# Patient Record
Sex: Female | Born: 1970 | Race: White | Hispanic: No | Marital: Married | Smoking: Current every day smoker
Health system: Southern US, Community
[De-identification: ages and names within clinical notes are randomized; demographics above are authoritative.]

## PROBLEM LIST (undated history)

## (undated) DIAGNOSIS — F419 Anxiety disorder, unspecified: Secondary | ICD-10-CM

## (undated) DIAGNOSIS — F329 Major depressive disorder, single episode, unspecified: Secondary | ICD-10-CM

## (undated) DIAGNOSIS — N871 Moderate cervical dysplasia: Secondary | ICD-10-CM

## (undated) DIAGNOSIS — IMO0002 Reserved for concepts with insufficient information to code with codable children: Secondary | ICD-10-CM

## (undated) DIAGNOSIS — G479 Sleep disorder, unspecified: Secondary | ICD-10-CM

## (undated) DIAGNOSIS — R87629 Unspecified abnormal cytological findings in specimens from vagina: Secondary | ICD-10-CM

## (undated) DIAGNOSIS — E559 Vitamin D deficiency, unspecified: Secondary | ICD-10-CM

## (undated) DIAGNOSIS — F32A Depression, unspecified: Secondary | ICD-10-CM

## (undated) DIAGNOSIS — Z87898 Personal history of other specified conditions: Secondary | ICD-10-CM

## (undated) DIAGNOSIS — K2960 Other gastritis without bleeding: Secondary | ICD-10-CM

## (undated) DIAGNOSIS — K219 Gastro-esophageal reflux disease without esophagitis: Secondary | ICD-10-CM

## (undated) DIAGNOSIS — Z87442 Personal history of urinary calculi: Secondary | ICD-10-CM

## (undated) HISTORY — DX: Depression, unspecified: F32.A

## (undated) HISTORY — DX: Personal history of other specified conditions: Z87.898

## (undated) HISTORY — DX: Anxiety disorder, unspecified: F41.9

## (undated) HISTORY — DX: Reserved for concepts with insufficient information to code with codable children: IMO0002

## (undated) HISTORY — DX: Vitamin D deficiency, unspecified: E55.9

## (undated) HISTORY — PX: BREAST EXCISIONAL BIOPSY: SUR124

## (undated) HISTORY — DX: Sleep disorder, unspecified: G47.9

## (undated) HISTORY — DX: Unspecified abnormal cytological findings in specimens from vagina: R87.629

## (undated) HISTORY — DX: Moderate cervical dysplasia: N87.1

## (undated) HISTORY — DX: Major depressive disorder, single episode, unspecified: F32.9

## (undated) HISTORY — DX: Other gastritis without bleeding: K29.60

## (undated) HISTORY — PX: COLPOSCOPY W/ BIOPSY / CURETTAGE: SUR283

---

## 2006-04-17 ENCOUNTER — Emergency Department: Payer: Self-pay | Admitting: Emergency Medicine

## 2008-10-26 ENCOUNTER — Emergency Department: Payer: Self-pay

## 2008-10-28 ENCOUNTER — Ambulatory Visit: Payer: Self-pay | Admitting: Physician Assistant

## 2008-12-05 HISTORY — PX: FOOT SURGERY: SHX648

## 2010-05-19 ENCOUNTER — Observation Stay: Payer: Self-pay | Admitting: Specialist

## 2012-09-28 ENCOUNTER — Ambulatory Visit: Payer: Self-pay | Admitting: Family Medicine

## 2013-09-19 ENCOUNTER — Emergency Department: Payer: Self-pay | Admitting: Emergency Medicine

## 2013-09-19 LAB — COMPREHENSIVE METABOLIC PANEL
Alkaline Phosphatase: 71 U/L (ref 50–136)
BUN: 13 mg/dL (ref 7–18)
Bilirubin,Total: 0.6 mg/dL (ref 0.2–1.0)
Calcium, Total: 9 mg/dL (ref 8.5–10.1)
Glucose: 116 mg/dL — ABNORMAL HIGH (ref 65–99)
Osmolality: 277 (ref 275–301)
SGOT(AST): 26 U/L (ref 15–37)
SGPT (ALT): 21 U/L (ref 12–78)
Sodium: 138 mmol/L (ref 136–145)

## 2013-09-19 LAB — URINALYSIS, COMPLETE
Bilirubin,UR: NEGATIVE
Blood: NEGATIVE
Ketone: NEGATIVE
Leukocyte Esterase: NEGATIVE
Nitrite: NEGATIVE
Ph: 6 (ref 4.5–8.0)
Protein: NEGATIVE
RBC,UR: 2 /HPF (ref 0–5)
Squamous Epithelial: 1
WBC UR: 1 /HPF (ref 0–5)

## 2013-09-19 LAB — CBC
HCT: 37.6 % (ref 35.0–47.0)
HGB: 13.4 g/dL (ref 12.0–16.0)
MCH: 31.4 pg (ref 26.0–34.0)
MCV: 88 fL (ref 80–100)
Platelet: 201 10*3/uL (ref 150–440)
RBC: 4.26 10*6/uL (ref 3.80–5.20)

## 2013-09-19 LAB — WET PREP, GENITAL

## 2014-11-16 LAB — LIPID PANEL
CHOLESTEROL: 184 mg/dL (ref 0–200)
HDL: 80 mg/dL — AB (ref 35–70)
LDL Cholesterol: 87 mg/dL
TRIGLYCERIDES: 86 mg/dL (ref 40–160)

## 2014-11-16 LAB — TSH: TSH: 1.62 u[IU]/mL (ref <=5.90)

## 2014-11-16 LAB — BASIC METABOLIC PANEL: Glucose: 83 mg/dL

## 2014-11-16 LAB — HM PAP SMEAR

## 2015-01-22 DIAGNOSIS — D069 Carcinoma in situ of cervix, unspecified: Secondary | ICD-10-CM | POA: Insufficient documentation

## 2015-02-03 HISTORY — PX: LEEP: SHX91

## 2015-02-04 ENCOUNTER — Ambulatory Visit: Payer: Self-pay | Admitting: Obstetrics and Gynecology

## 2015-02-09 ENCOUNTER — Ambulatory Visit: Payer: Self-pay | Admitting: Obstetrics and Gynecology

## 2015-02-18 DIAGNOSIS — Z9889 Other specified postprocedural states: Secondary | ICD-10-CM | POA: Insufficient documentation

## 2015-03-10 ENCOUNTER — Ambulatory Visit
Admit: 2015-03-10 | Disposition: A | Discharge status: Home / Self Care | Payer: Self-pay | Attending: Obstetrics and Gynecology | Admitting: Obstetrics and Gynecology

## 2015-03-30 LAB — SURGICAL PATHOLOGY

## 2015-04-05 NOTE — Op Note (Signed)
PATIENT NAME:  Jasmine Hodges, Jasmine Hodges MR#:  [id number] DATE OF BIRTH:  1971/11/27  DATE OF PROCEDURE:  02/09/2015  PREOPERATIVE DIAGNOSIS: Cervical intraepithelial neoplasia, 2-3, with positive endocervical curettage, and Mirena intrauterine device  in place.   POSTOPERATIVE DIAGNOSIS: Cervical intraepithelial neoplasia, 2-3, with positive endocervical curettage, and Mirena intrauterine device  in place.   OPERATION: Loop electrosurgical excision procedure with endocervical curettings.   SURGEON:  Yasmin Bronaugh S. Marcelline Mates, MD  ANESTHESIA: General.   ESTIMATED BLOOD LOSS: 25 mL.  OPERATIVE FLUIDS: 900 mL.   URINE OUTPUT: 50 mL.   COMPLICATIONS: None.   FINDINGS: Mirena IUD strings were visible from the cervical os. There was no decreased Lugol uptake during staining.    SPECIMEN:  ECC, ectocervix and endocervix.   CONDITION: Stable.   INDICATIONS FOR PROCEDURE: The patient is a 44 year old multiparous female with a history of abnormal Pap smear and colposcopy demonstrated CIN-2, with ECC demonstrating CIN-3. The patient has no significant past medical history or surgical history.  She was advised to undergo a LEEP procedure with ECC and all risks and benefits were discussed with the patient including potential risk of loss of Mirena IUD or shortening of Mirena IUD string,  cervical incompetence or cervical stenosis. The patient accepted all risks.    PROCEDURE: The patient was taken to the operating room where she was placed under general anesthesia without difficulty. She was then prepped and draped in a normal sterile fashion and placed in the dorsal lithotomy position. Next, a Graves speculum was placed into the vagina. Lugol solution was painted along the entire cervix and vaginal wall. The areas of non- uptake were noted to be around the entire squamocolumnar junction. The large loop electrode was then used to remove the anterior or top portion of the cervix and then a separate posterior specimen was  excised and sent to pathology. The top portion of the cervix was tagged at 12:00 using a 0 non-coated Vicryl.  The posterior specimen was tagged at 6:00 and was tagged with a coated 0 Vicryl. Next, a smaller loop electrode was used to obtain a circumferential excision of the endocervix. An endocervical curettage was performed and the specimen was placed on Telfa pad and sent to pathology.   At the end of the procedure, the IUD strings were noted to have been shortened; however, were still visible in the endocervical canal. The rollerball was then used to achieve hemostasis. An astringent solution was then applied to the cervix for added hemostasis. The speculum was removed from the patient's vagina. The patient tolerated the procedure well. She was then taken to the recovery room in stable condition.   ____________________________ Chesley Noon Marcelline Mates, MD asc:LT D: 02/09/2015 16:30:35 ET T: 02/09/2015 19:31:13 ET JOB#: [id number]  cc: Chesley Noon. Marcelline Mates, MD, <Dictator> Augusto Gamble MD ELECTRONICALLY SIGNED 03/16/2015 11:03

## 2015-08-24 ENCOUNTER — Encounter: Payer: Self-pay | Admitting: *Deleted

## 2015-08-28 ENCOUNTER — Ambulatory Visit: Payer: Self-pay | Admitting: Obstetrics and Gynecology

## 2015-09-17 ENCOUNTER — Other Ambulatory Visit: Payer: Self-pay | Admitting: Obstetrics and Gynecology

## 2015-09-17 ENCOUNTER — Ambulatory Visit (INDEPENDENT_AMBULATORY_CARE_PROVIDER_SITE_OTHER): Payer: BC Managed Care – PPO | Admitting: Obstetrics and Gynecology

## 2015-09-17 ENCOUNTER — Encounter: Payer: Self-pay | Admitting: Obstetrics and Gynecology

## 2015-09-17 VITALS — BP 109/66 | HR 73 | Ht 66.0 in | Wt 145.8 lb

## 2015-09-17 DIAGNOSIS — R87613 High grade squamous intraepithelial lesion on cytologic smear of cervix (HGSIL): Secondary | ICD-10-CM

## 2015-09-17 NOTE — Progress Notes (Signed)
Subjective:     Patient ID: Jasmine Hodges, female   DOB: September 04, 1971, 44 y.o.   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    Assessment:     H/o HGSIL, S/P LEEP     Plan:     Pap obtained, will proceed with next pap at [place] in 6 months  [place], [place]

## 2015-09-22 LAB — CYTOLOGY - PAP

## 2015-09-29 ENCOUNTER — Other Ambulatory Visit: Payer: Self-pay | Admitting: Obstetrics and Gynecology

## 2015-09-29 DIAGNOSIS — IMO0002 Reserved for concepts with insufficient information to code with codable children: Secondary | ICD-10-CM

## 2015-09-30 ENCOUNTER — Telehealth: Payer: Self-pay | Admitting: Obstetrics and Gynecology

## 2015-09-30 NOTE — Telephone Encounter (Signed)
SHE CALLED FOR RESULTS OD PAP SMEAR.

## 2015-10-01 NOTE — Telephone Encounter (Signed)
Please see lab note and give her call back for me

## 2015-10-02 ENCOUNTER — Telehealth: Payer: Self-pay | Admitting: *Deleted

## 2015-10-02 NOTE — Telephone Encounter (Signed)
Notified pt of results, she will do colpo with Dr.Cherry or MNB whichever has the 1st available

## 2015-10-02 NOTE — Telephone Encounter (Signed)
Pt was notified about her results, appt made for colpo

## 2015-10-02 NOTE — Telephone Encounter (Signed)
-----   Message from Jasmine Hodges, [place] sent at 10/01/2015  4:29 PM EDT ----- Can you let her know pap results went back up to LGSIL- will need to repeat the colpo- I talked to [facility] and she is fine with doing it or I can do it- Which ever Candies desires, and will follow-up accordingly. Needs to schedule.

## 2015-10-21 ENCOUNTER — Ambulatory Visit (INDEPENDENT_AMBULATORY_CARE_PROVIDER_SITE_OTHER): Payer: BC Managed Care – PPO | Admitting: Obstetrics and Gynecology

## 2015-10-21 ENCOUNTER — Encounter: Payer: Self-pay | Admitting: Obstetrics and Gynecology

## 2015-10-21 VITALS — BP 101/65 | HR 61 | Ht 66.0 in | Wt 146.3 lb

## 2015-10-21 DIAGNOSIS — Z9889 Other specified postprocedural states: Secondary | ICD-10-CM

## 2015-10-21 DIAGNOSIS — R87612 Low grade squamous intraepithelial lesion on cytologic smear of cervix (LGSIL): Secondary | ICD-10-CM

## 2015-10-21 DIAGNOSIS — N871 Moderate cervical dysplasia: Secondary | ICD-10-CM

## 2015-10-23 LAB — PATHOLOGY

## 2015-10-23 NOTE — Progress Notes (Signed)
GYNECOLOGY CLINIC COLPOSCOPY PROCEDURE NOTE  44 y.o. [id number] here for colposcopy for low-grade squamous intraepithelial neoplasia (LGSIL - encompassing HPV,mild dysplasia,CIN I) pap smear on 09/2015. Patient has a history of CIN II, is s/p LEEP in 02/2015.  Patient given verbal informed consent.  Placed in lithotomy position. Cervix viewed with speculum and colposcope after application of acetic acid.   Colposcopy adequate? Yes  acetowhite lesion(s) noted at 5 o'clock and mosaicism noted at 3 and 9 o'clock; corresponding biopsies obtained.  ECC specimen obtained. All specimens were labelled and sent to pathology.  Patient was given post procedure instructions.  Will follow up pathology and manage accordingly.  Routine preventative health maintenance measures emphasized.    Rubie Maid, MD [facility]

## 2015-10-27 ENCOUNTER — Encounter: Payer: Self-pay | Admitting: Obstetrics and Gynecology

## 2015-11-03 ENCOUNTER — Ambulatory Visit: Payer: BC Managed Care – PPO | Admitting: Obstetrics and Gynecology

## 2015-11-11 ENCOUNTER — Encounter: Payer: Self-pay | Admitting: Obstetrics and Gynecology

## 2016-02-16 ENCOUNTER — Other Ambulatory Visit: Payer: Self-pay | Admitting: Obstetrics and Gynecology

## 2016-03-17 ENCOUNTER — Encounter: Payer: BC Managed Care – PPO | Admitting: Obstetrics and Gynecology

## 2016-03-21 ENCOUNTER — Other Ambulatory Visit: Payer: Self-pay | Admitting: Nurse Practitioner

## 2016-03-21 ENCOUNTER — Ambulatory Visit
Admission: RE | Admit: 2016-03-21 | Discharge: 2016-03-21 | Disposition: A | Discharge status: Home / Self Care | Payer: BC Managed Care – PPO | Source: Ambulatory Visit | Attending: Nurse Practitioner | Admitting: Nurse Practitioner

## 2016-03-21 DIAGNOSIS — N200 Calculus of kidney: Secondary | ICD-10-CM

## 2016-03-23 ENCOUNTER — Encounter: Payer: Self-pay | Admitting: Urology

## 2016-03-23 ENCOUNTER — Ambulatory Visit (INDEPENDENT_AMBULATORY_CARE_PROVIDER_SITE_OTHER): Payer: BC Managed Care – PPO | Admitting: Urology

## 2016-03-23 VITALS — BP 93/56 | HR 56 | Ht 66.0 in | Wt 147.9 lb

## 2016-03-23 DIAGNOSIS — R109 Unspecified abdominal pain: Secondary | ICD-10-CM

## 2016-03-23 DIAGNOSIS — R310 Gross hematuria: Secondary | ICD-10-CM

## 2016-03-23 DIAGNOSIS — N200 Calculus of kidney: Secondary | ICD-10-CM

## 2016-03-23 LAB — URINALYSIS, COMPLETE
Bilirubin, UA: NEGATIVE
GLUCOSE, UA: NEGATIVE
KETONES UA: NEGATIVE
LEUKOCYTES UA: NEGATIVE
Nitrite, UA: NEGATIVE
Protein, UA: NEGATIVE
RBC, UA: NEGATIVE
Urobilinogen, Ur: 0.2 mg/dL (ref 0.2–1.0)
pH, UA: 7 (ref 5.0–7.5)

## 2016-03-23 LAB — MICROSCOPIC EXAMINATION
Bacteria, UA: NONE SEEN
RBC, UA: NONE SEEN /hpf (ref 0–?)

## 2016-03-23 MED ORDER — TAMSULOSIN HCL 0.4 MG PO CAPS: 0.4000 mg | ORAL_CAPSULE | Freq: Every day | ORAL | Status: DC

## 2016-03-23 MED ORDER — OXYCODONE-ACETAMINOPHEN 10-325 MG PO TABS: 1.0000 | ORAL_TABLET | ORAL | Status: DC | PRN

## 2016-03-23 NOTE — Progress Notes (Signed)
03/23/2016 12:06 PM   Jasmine Hodges [birth year] [id number]  Referring provider: [name], NP [place], [place] [address]  Chief Complaint  Patient presents with  . Nephrolithiasis    referred by [facility]    HPI: Patient is a 45 year old Caucasian female referred to [place] by her primary care physician, [name], NP, were a possible left ureteral stone.  Patient states about 1 week ago she had the sudden onset of left-sided back pain with gross hematuria. The pain was intense and then eased off. It then returned to the left groin area and now it feels like a bad toothache.  She has urinary frequency associated with this pain.  She denied fever and chills, but she admitted to nausea and vomiting.  She had a renal ultrasound completed on 03/21/2016 which noted suggestions of a non obstructing stone in the distal left ureter but could not be definitive.  She believes she may have had a stone approximately 14 years ago during a pregnancy, but she never passed a fragment and she states it was not demonstrated on imaging.  She is a former smoker.  UA today was unremarkable.  PMH: Past Medical History  Diagnosis Date  . Vitamin D deficiency   . Anxiety   . Depression   . Sleep disturbance   . Reflux gastritis   . Vaginal Pap smear, abnormal   . Kidney stone     Surgical History: Past Surgical History  Procedure Laterality Date  . Leep  02/2015  . Foot surgery Left 2010    Home Medications:    Medication List       This list is accurate as of: 03/23/16 12:06 PM.  Always use your most recent med list.               ALPRAZolam 0.5 MG tablet  Commonly known as:  XANAX  Take 0.5 mg by mouth at bedtime as needed for anxiety.     ciprofloxacin 500 MG tablet  Commonly known as:  CIPRO  Reported on 03/23/2016     HYDROcodone-acetaminophen 5-325 MG tablet  Commonly known as:  NORCO/VICODIN     Melatonin 3 MG Tabs  Take by mouth.     oxyCODONE-acetaminophen 10-325 MG tablet  Commonly known as:  PERCOCET  Take 1 tablet by mouth every 4 (four) hours as needed for pain.     tamsulosin 0.4 MG Caps capsule  Commonly known as:  FLOMAX  Take 1 capsule (0.4 mg total) by mouth daily. Reported on 03/23/2016        Allergies: No Known Allergies  Family History: Family History  Problem Relation Age of Onset  . Lung cancer Mother   . Hypertension Father   . Kidney disease Neg Hx   . Bladder Cancer Neg Hx   . Kidney Stones Father     Social History:  reports that she has quit smoking. She has never used smokeless tobacco. She reports that she drinks alcohol. She reports that she does not use illicit drugs.  ROS: UROLOGY Frequent Urination?: Yes Hard to postpone urination?: No Burning/pain with urination?: No Get up at night to urinate?: No Leakage of urine?: No Urine stream starts and stops?: No Trouble starting stream?: No Do you have to strain to urinate?: No Blood in urine?: No Urinary tract infection?: No Sexually transmitted disease?: No Injury to kidneys or bladder?: No Painful intercourse?: No Weak stream?: No Currently pregnant?: No Vaginal bleeding?: No Last menstrual  period?: 0414/2017  Gastrointestinal Nausea?: No Vomiting?: No Indigestion/heartburn?: No Diarrhea?: No Constipation?: No  Constitutional Fever: No Night sweats?: No Weight loss?: No Fatigue?: No  Skin Skin rash/lesions?: No Itching?: No  Eyes Blurred vision?: No Double vision?: No  Ears/Nose/Throat Sore throat?: No Sinus problems?: No  Hematologic/Lymphatic Swollen glands?: No Easy bruising?: No  Cardiovascular Leg swelling?: No Chest pain?: No  Respiratory Cough?: No Shortness of breath?: No  Endocrine Excessive thirst?: No  Musculoskeletal Back pain?: No Joint pain?: No  Neurological Headaches?: No Dizziness?: No  Psychologic Depression?: No Anxiety?: No  Physical Exam: BP 93/56 mmHg   Pulse 56  Ht 5\' 6"  (1.676 m)  Wt 147 lb 14.4 oz (67.087 kg)  BMI 23.88 kg/m2  LMP 03/18/2016  Constitutional: Well nourished. Alert and oriented, No acute distress. HEENT: [place] AT, moist mucus membranes. Trachea midline, no masses. Cardiovascular: No clubbing, cyanosis, or edema. Respiratory: Normal respiratory effort, no increased work of breathing. GI: Abdomen is soft, non tender, non distended, no abdominal masses. Liver and spleen not palpable.  No hernias appreciated.  Stool sample for occult testing is not indicated.   GU: No CVA tenderness.  No bladder fullness or masses.   Skin: No rashes, bruises or suspicious lesions. Lymph: No cervical or inguinal adenopathy. Neurologic: Grossly intact, no focal deficits, moving all 4 extremities. Psychiatric: Normal mood and affect.  Laboratory Data: Lab Results  Component Value Date   WBC 8.2 09/19/2013   HGB 13.4 09/19/2013   HCT 37.6 09/19/2013   MCV 88 09/19/2013   PLT 201 09/19/2013    Lab Results  Component Value Date   CREATININE 0.85 09/19/2013     Lab Results  Component Value Date   TSH 1.62 11/16/2014       Component Value Date/Time   CHOL 184 11/16/2014   HDL 80* 11/16/2014   LDLCALC 87 11/16/2014    Lab Results  Component Value Date   AST 26 09/19/2013   Lab Results  Component Value Date   ALT 21 09/19/2013   Urinalysis Results for orders placed or performed in visit on 03/23/16  Microscopic Examination  Result Value Ref Range   WBC, UA 0-5 0 -  5 /hpf   RBC, UA None seen 0 -  2 /hpf   Epithelial Cells (non renal) 0-10 0 - 10 /hpf   Bacteria, UA None seen None seen/Few  Urinalysis, Complete  Result Value Ref Range   Specific Gravity, UA <1.005 (L) 1.005 - 1.030   pH, UA 7.0 5.0 - 7.5   Color, UA Yellow Yellow   Appearance Ur Clear Clear   Leukocytes, UA Negative Negative   Protein, UA Negative Negative/Trace   Glucose, UA Negative Negative   Ketones, UA Negative Negative   RBC, UA Negative  Negative   Bilirubin, UA Negative Negative   Urobilinogen, Ur 0.2 0.2 - 1.0 mg/dL   Nitrite, UA Negative Negative   Microscopic Examination See below:    Pertinent Imaging: CLINICAL DATA: Left flank pain.  EXAM: RENAL / URINARY TRACT ULTRASOUND COMPLETE  COMPARISON: None  FINDINGS: Right Kidney:  Length: 10.3 cm. Slight fullness of the right renal pelvis without a visible stone. Echogenicity within normal limits. No mass or hydronephrosis visualized.  Left Kidney:  Length: 10.0 cm. Bilateral ureteral jets identified.  Bladder:  Appears normal for degree of bladder distention. On image 35 of 36 there is suggestion of a 5 mm stone in the distal left ureter but this is not definitive and  the ureter certainly patent.  IMPRESSION: Suggestion of a nonobstructing stone in the distal left ureter but this is not definitive.   Electronically Signed  By: Lorriane Shire M.D.  On: 03/21/2016 16:26   Assessment & Plan:    1. Kidney stones:   Patient had findings suggestive of a kidney stone on renal ultrasound, but it is not definitive.  We'll obtain a CT urogram at this time.  I explained to the patient that a CT urogram was needed at this time so that we could clearly identify a stone if it is present.  It will also help [place] decide what definitive treatment would give her the best success of ridding her of the stone if one is present.  I also explained because of the blood in the urine undergoing a hematuria workup at this time is prudent especially with her history of smoking.  2. Gross hematuria:   Explained to patient the causes of blood in the urine are as follows: stones, UTI's, damage to the urinary tract and/or cancer.   It is explained to the patient that they will be scheduled for a CT Urogram with contrast material and that in rare instances, an allergic reaction can be serious and even life threatening with the injection of contrast material.   The patient  denies any allergies to contrast, iodine and/or seafood and is not taking metformin.  - Urinalysis, Complete - CULTURE, URINE COMPREHENSIVE  3. Left flank pain:  Patient was given Percocet 10/325  #10.  A refill is given for her tamsulosin.  She does have a strainer and will continue to strain her urine.  Contact us if she passes a stone.  If she should experience fevers/chills, intractable vomiting or pain, she should contact the office immediately or seek treatment in the emergency room.  CT urogram is pending at this time.   Return for I will call the patient with results.  These notes generated with voice recognition software. I apologize for typographical errors.  Zara Council, [facility] [address], [place],  [address] [phone]

## 2016-03-24 ENCOUNTER — Encounter: Payer: BC Managed Care – PPO | Admitting: Obstetrics and Gynecology

## 2016-03-24 LAB — BUN+CREAT
BUN/Creatinine Ratio: 19 (ref 9–23)
BUN: 16 mg/dL (ref 6–24)
Creatinine, Ser: 0.84 mg/dL (ref 0.57–1.00)
GFR calc non Af Amer: 85 mL/min/{1.73_m2} (ref >59)
GFR, EST AFRICAN AMERICAN: 98 mL/min/{1.73_m2} (ref >59)

## 2016-03-24 LAB — HCG, SERUM, QUALITATIVE: hCG,Beta Subunit,Qual,Serum: NEGATIVE m[IU]/mL (ref <6)

## 2016-03-25 ENCOUNTER — Ambulatory Visit
Admission: RE | Admit: 2016-03-25 | Discharge: 2016-03-25 | Disposition: A | Discharge status: Home / Self Care | Payer: BC Managed Care – PPO | Source: Ambulatory Visit | Attending: Urology | Admitting: Urology

## 2016-03-25 DIAGNOSIS — N201 Calculus of ureter: Secondary | ICD-10-CM | POA: Diagnosis not present

## 2016-03-25 DIAGNOSIS — K769 Liver disease, unspecified: Secondary | ICD-10-CM | POA: Insufficient documentation

## 2016-03-25 DIAGNOSIS — Z975 Presence of (intrauterine) contraceptive device: Secondary | ICD-10-CM | POA: Insufficient documentation

## 2016-03-25 DIAGNOSIS — R310 Gross hematuria: Secondary | ICD-10-CM | POA: Diagnosis not present

## 2016-03-25 MED ORDER — IOPAMIDOL (ISOVUE-300) INJECTION 61%
150.0000 mL | Freq: Once | INTRAVENOUS | Status: AC | PRN
  Administered 2016-03-25: 150 mL via INTRAVENOUS

## 2016-03-27 LAB — CULTURE, URINE COMPREHENSIVE

## 2016-03-28 ENCOUNTER — Telehealth: Payer: Self-pay | Admitting: Radiology

## 2016-03-28 ENCOUNTER — Other Ambulatory Visit: Payer: Self-pay | Admitting: Urology

## 2016-03-28 DIAGNOSIS — R109 Unspecified abdominal pain: Secondary | ICD-10-CM

## 2016-03-28 MED ORDER — OXYCODONE-ACETAMINOPHEN 10-325 MG PO TABS: 1.0000 | ORAL_TABLET | ORAL | Status: DC | PRN

## 2016-03-28 NOTE — Telephone Encounter (Signed)
Pt's husband, Shon Baton, called stating pt needs a refill of percocet. States pt's pain is 8/10 prior to taking medication and decreases to 3-4/10 after taking it. He asks if this is normal & can she wait until surgery that is scheduled for 04/11/16? Please advise.

## 2016-03-28 NOTE — Telephone Encounter (Signed)
Refill for Percocet is given.  Prescription is printed. It will need to be signed.  Her pain is normal for a ureteral stone.  As long as she can control the pain with analgesics, she is not experiencing intractable vomiting, she is not having fevers or chills, it is reasonable to wait until May 8 for the procedure.  The mean passage rate for stones 6-8 mm in size is 22 days.  If she can no longer control the pain with analgesics, experiences intractable vomiting or develops fevers or chills, she will need to seek treatment in the emergency room.  At that time, a ureteral stent will be placed to relieve the obstruction and the stone would be addressed at a different encounter.  I suggest she continue her Flomax, take the Percocet every 4 hours if needed for pain and push fluids in an effort to try to pass the stone.

## 2016-03-28 NOTE — Telephone Encounter (Signed)
Notified pt's husband, Shon Baton, of surgery scheduled 04/11/16, pre-admit testing appt on 4/26 @ 10:15 and to call Friday prior to surgery for arrival time to SDS. [place] voices understanding.

## 2016-03-29 NOTE — Telephone Encounter (Signed)
Signed order was given to pt's husband. Printed and read below message from Sun to him. Questions answered. He voices understanding.

## 2016-03-30 ENCOUNTER — Encounter
Admission: RE | Admit: 2016-03-30 | Discharge: 2016-03-30 | Disposition: A | Discharge status: Home / Self Care | Payer: BC Managed Care – PPO | Source: Ambulatory Visit | Attending: Urology | Admitting: Urology

## 2016-03-30 DIAGNOSIS — Z01812 Encounter for preprocedural laboratory examination: Secondary | ICD-10-CM | POA: Insufficient documentation

## 2016-03-30 HISTORY — DX: Gastro-esophageal reflux disease without esophagitis: K21.9

## 2016-03-30 LAB — CBC
HEMATOCRIT: 38.6 % (ref 35.0–47.0)
Hemoglobin: 13.1 g/dL (ref 12.0–16.0)
MCH: 30 pg (ref 26.0–34.0)
MCHC: 34 g/dL (ref 32.0–36.0)
MCV: 88.1 fL (ref 80.0–100.0)
Platelets: 209 10*3/uL (ref 150–440)
RBC: 4.38 MIL/uL (ref 3.80–5.20)
RDW: 12.5 % (ref 11.5–14.5)
WBC: 8.3 10*3/uL (ref 3.6–11.0)

## 2016-03-30 LAB — BASIC METABOLIC PANEL
Anion gap: 5 (ref 5–15)
BUN: 17 mg/dL (ref 6–20)
CALCIUM: 9.2 mg/dL (ref 8.9–10.3)
CO2: 29 mmol/L (ref 22–32)
CREATININE: 0.82 mg/dL (ref 0.44–1.00)
Chloride: 105 mmol/L (ref 101–111)
GFR calc non Af Amer: >60 mL/min (ref >60)
GLUCOSE: 80 mg/dL (ref 65–99)
Potassium: 4.1 mmol/L (ref 3.5–5.1)
Sodium: 139 mmol/L (ref 135–145)

## 2016-03-30 NOTE — Patient Instructions (Signed)
  Your procedure is scheduled on: 04/11/16 Mon Report to Same Day Surgery 2nd floor medical mall To find out your arrival time please call [phone] between 1PM - 3PM on  04/08/16 Fri  Remember: Instructions that are not followed completely may result in serious medical risk, up to and including death, or upon the discretion of your surgeon and anesthesiologist your surgery may need to be rescheduled.    _x___ 1. Do not eat food or drink liquids after midnight. No gum chewing or hard candies.     __x__ 2. No Alcohol for 24 hours before or after surgery.   ____ 3. Bring all medications with you on the day of surgery if instructed.    __x__ 4. Notify your doctor if there is any change in your medical condition     (cold, fever, infections).     Do not wear jewelry, make-up, hairpins, clips or nail polish.  Do not wear lotions, powders, or perfumes. You may wear deodorant.  Do not shave 48 hours prior to surgery. Men may shave face and neck.  Do not bring valuables to the hospital.    [facility] is not responsible for any belongings or valuables.               Contacts, dentures or bridgework may not be worn into surgery.  Leave your suitcase in the car. After surgery it may be brought to your room.  For patients admitted to the hospital, discharge time is determined by your treatment team.   Patients discharged the day of surgery will not be allowed to drive home.    Please read over the following fact sheets that you were given:   [facility] Preparing for Surgery and or MRSA Information   _x___ Take these medicines the morning of surgery with A SIP OF WATER:    1. oxyCODONE-acetaminophen (PERCOCET) 10-325 MG tablet if needed  2.  3.  4.  5.  6.  ____ Fleet Enema (as directed)   _x___ Use CHG Soap or sage wipes as directed on instruction sheet   ____ Use inhalers on the day of surgery and bring to hospital day of surgery  ____ Stop metformin 2 days prior to  surgery    ____ Take 1/2 of usual insulin dose the night before surgery and none on the morning of           surgery.   ____ Stop aspirin or coumadin, or plavix  _x__ Stop Anti-inflammatories such as Advil, Aleve, Ibuprofen, Motrin, Naproxen,          Naprosyn, Goodies powders or aspirin products. Ok to take Tylenol.   ____ Stop supplements until after surgery.    ____ Bring C-Pap to the hospital.

## 2016-03-31 ENCOUNTER — Telehealth: Payer: Self-pay

## 2016-03-31 ENCOUNTER — Other Ambulatory Visit: Payer: Self-pay | Admitting: Urology

## 2016-03-31 DIAGNOSIS — R109 Unspecified abdominal pain: Secondary | ICD-10-CM

## 2016-03-31 MED ORDER — OXYCODONE-ACETAMINOPHEN 10-325 MG PO TABS: 1.0000 | ORAL_TABLET | ORAL | Status: DC | PRN

## 2016-03-31 NOTE — Telephone Encounter (Signed)
They can pick up another script tomorrow.  Will you ask Amy if we have had any new block time added or cancellations?  If so, we may be able to move her procedure up.

## 2016-03-31 NOTE — Telephone Encounter (Signed)
I have no called the pt yet.

## 2016-03-31 NOTE — Telephone Encounter (Signed)
Pt husband, Shon Baton, called stating pt is still "in a good amount of pain" and wanted to know if she could have more pain medication. Husband states pt takes a pain pill in the morning and one at night before bed and "at this rate she will run out over the weekend and we dont want to go to the ER".  Please advise.

## 2016-04-01 MED ORDER — OXYCODONE-ACETAMINOPHEN 10-325 MG PO TABS: 1.0000 | ORAL_TABLET | ORAL | Status: DC | PRN

## 2016-04-01 NOTE — Telephone Encounter (Signed)
Spoke with pt husband and made aware that a script for pain medication has been put up front. Husband voiced understanding.

## 2016-04-01 NOTE — Telephone Encounter (Signed)
We don't currently have an opening to move the procedure up but if someone cancels we can move it up then.

## 2016-04-05 ENCOUNTER — Other Ambulatory Visit: Payer: Self-pay | Admitting: Urology

## 2016-04-05 ENCOUNTER — Telehealth: Payer: Self-pay | Admitting: Urology

## 2016-04-05 DIAGNOSIS — R109 Unspecified abdominal pain: Secondary | ICD-10-CM

## 2016-04-05 MED ORDER — OXYCODONE-ACETAMINOPHEN 10-325 MG PO TABS: 1.0000 | ORAL_TABLET | ORAL | Status: DC | PRN

## 2016-04-05 NOTE — Telephone Encounter (Signed)
Please advise 

## 2016-04-05 NOTE — Pre-Procedure Instructions (Signed)
EKG no change from Jun 2011

## 2016-04-05 NOTE — Telephone Encounter (Signed)
I have printed a script for the pain medication.  I will have to sign it.

## 2016-04-05 NOTE — Telephone Encounter (Signed)
Spoke with husband and made aware a script is left up front. Husband voiced understanding.

## 2016-04-05 NOTE — Telephone Encounter (Signed)
Patient is scheduled for surgery 04/11/16.  She will run out of pain medication tomorrow morning.  Patient's husband is requesting another prescription for pain medication to get her through until surgery.

## 2016-04-11 ENCOUNTER — Ambulatory Visit: Payer: BC Managed Care – PPO | Admitting: Anesthesiology

## 2016-04-11 ENCOUNTER — Encounter
Admission: RE | Disposition: A | Discharge status: Home / Self Care | Payer: Self-pay | Source: Ambulatory Visit | Attending: Urology

## 2016-04-11 ENCOUNTER — Encounter: Payer: Self-pay | Admitting: *Deleted

## 2016-04-11 ENCOUNTER — Ambulatory Visit
Admission: RE | Admit: 2016-04-11 | Discharge: 2016-04-11 | Disposition: A | Discharge status: Home / Self Care | Payer: BC Managed Care – PPO | Source: Ambulatory Visit | Attending: Urology | Admitting: Urology

## 2016-04-11 DIAGNOSIS — Z9889 Other specified postprocedural states: Secondary | ICD-10-CM | POA: Diagnosis not present

## 2016-04-11 DIAGNOSIS — R109 Unspecified abdominal pain: Secondary | ICD-10-CM

## 2016-04-11 DIAGNOSIS — Z87891 Personal history of nicotine dependence: Secondary | ICD-10-CM | POA: Insufficient documentation

## 2016-04-11 DIAGNOSIS — F329 Major depressive disorder, single episode, unspecified: Secondary | ICD-10-CM | POA: Insufficient documentation

## 2016-04-11 DIAGNOSIS — Z8249 Family history of ischemic heart disease and other diseases of the circulatory system: Secondary | ICD-10-CM | POA: Insufficient documentation

## 2016-04-11 DIAGNOSIS — Z801 Family history of malignant neoplasm of trachea, bronchus and lung: Secondary | ICD-10-CM | POA: Diagnosis not present

## 2016-04-11 DIAGNOSIS — Z79899 Other long term (current) drug therapy: Secondary | ICD-10-CM | POA: Insufficient documentation

## 2016-04-11 DIAGNOSIS — N201 Calculus of ureter: Secondary | ICD-10-CM

## 2016-04-11 DIAGNOSIS — Z87442 Personal history of urinary calculi: Secondary | ICD-10-CM | POA: Insufficient documentation

## 2016-04-11 DIAGNOSIS — R310 Gross hematuria: Secondary | ICD-10-CM | POA: Insufficient documentation

## 2016-04-11 DIAGNOSIS — F419 Anxiety disorder, unspecified: Secondary | ICD-10-CM | POA: Diagnosis not present

## 2016-04-11 HISTORY — PX: STONE EXTRACTION WITH BASKET: SHX5318

## 2016-04-11 HISTORY — PX: CYSTOSCOPY W/ RETROGRADES: SHX1426

## 2016-04-11 HISTORY — PX: CYSTOSCOPY/URETEROSCOPY/HOLMIUM LASER/STENT PLACEMENT: SHX6546

## 2016-04-11 LAB — POCT PREGNANCY, URINE: Preg Test, Ur: NEGATIVE

## 2016-04-11 SURGERY — CYSTOSCOPY, WITH RETROGRADE PYELOGRAM
Anesthesia: General | Site: Ureter | Wound class: Clean Contaminated

## 2016-04-11 MED ORDER — OXYBUTYNIN CHLORIDE 5 MG PO TABS: 5.0000 mg | ORAL_TABLET | Freq: Three times a day (TID) | ORAL | Status: DC | PRN

## 2016-04-11 MED ORDER — HYDROMORPHONE HCL 1 MG/ML IJ SOLN
INTRAMUSCULAR | Status: AC
  Filled 2016-04-11: qty 1

## 2016-04-11 MED ORDER — OXYCODONE HCL 5 MG/5ML PO SOLN: 5.0000 mg | Freq: Once | ORAL | Status: AC | PRN

## 2016-04-11 MED ORDER — ONDANSETRON HCL 4 MG/2ML IJ SOLN
INTRAMUSCULAR | Status: DC | PRN
  Administered 2016-04-11: 4 mg via INTRAVENOUS

## 2016-04-11 MED ORDER — LIDOCAINE HCL (CARDIAC) 20 MG/ML IV SOLN
INTRAVENOUS | Status: DC | PRN
  Administered 2016-04-11: 100 mg via INTRAVENOUS

## 2016-04-11 MED ORDER — FAMOTIDINE 20 MG PO TABS
ORAL_TABLET | ORAL | Status: AC
  Filled 2016-04-11: qty 1

## 2016-04-11 MED ORDER — CEFAZOLIN SODIUM-DEXTROSE 2-4 GM/100ML-% IV SOLN
INTRAVENOUS | Status: AC
  Filled 2016-04-11: qty 100

## 2016-04-11 MED ORDER — PROPOFOL 10 MG/ML IV BOLUS
INTRAVENOUS | Status: DC | PRN
  Administered 2016-04-11: 160 mg via INTRAVENOUS

## 2016-04-11 MED ORDER — OXYCODONE HCL 5 MG PO TABS
5.0000 mg | ORAL_TABLET | Freq: Once | ORAL | Status: AC | PRN
  Administered 2016-04-11: 5 mg via ORAL

## 2016-04-11 MED ORDER — FENTANYL CITRATE (PF) 100 MCG/2ML IJ SOLN
25.0000 ug | INTRAMUSCULAR | Status: DC | PRN
  Administered 2016-04-11 (×3): 50 ug via INTRAVENOUS

## 2016-04-11 MED ORDER — FENTANYL CITRATE (PF) 100 MCG/2ML IJ SOLN
INTRAMUSCULAR | Status: DC | PRN
  Administered 2016-04-11 (×2): 50 ug via INTRAVENOUS

## 2016-04-11 MED ORDER — MIDAZOLAM HCL 2 MG/2ML IJ SOLN
INTRAMUSCULAR | Status: DC | PRN
  Administered 2016-04-11: 2 mg via INTRAVENOUS

## 2016-04-11 MED ORDER — LACTATED RINGERS IV SOLN
INTRAVENOUS | Status: DC
  Administered 2016-04-11 (×3): via INTRAVENOUS

## 2016-04-11 MED ORDER — TAMSULOSIN HCL 0.4 MG PO CAPS: 0.4000 mg | ORAL_CAPSULE | Freq: Every day | ORAL | Status: DC

## 2016-04-11 MED ORDER — FAMOTIDINE 20 MG PO TABS
20.0000 mg | ORAL_TABLET | Freq: Once | ORAL | Status: AC
  Administered 2016-04-11: 20 mg via ORAL

## 2016-04-11 MED ORDER — FENTANYL CITRATE (PF) 100 MCG/2ML IJ SOLN
INTRAMUSCULAR | Status: AC
  Filled 2016-04-11: qty 4

## 2016-04-11 MED ORDER — OXYCODONE-ACETAMINOPHEN 10-325 MG PO TABS: 1.0000 | ORAL_TABLET | ORAL | Status: DC | PRN

## 2016-04-11 MED ORDER — HYDROMORPHONE HCL 1 MG/ML IJ SOLN
0.5000 mg | INTRAMUSCULAR | Status: AC | PRN
  Administered 2016-04-11 (×4): 0.5 mg via INTRAVENOUS

## 2016-04-11 MED ORDER — IOTHALAMATE MEGLUMINE 43 % IV SOLN
INTRAVENOUS | Status: DC | PRN
  Administered 2016-04-11: 20 mL via URETHRAL

## 2016-04-11 MED ORDER — CEFAZOLIN SODIUM-DEXTROSE 2-4 GM/100ML-% IV SOLN
2.0000 g | Freq: Once | INTRAVENOUS | Status: AC
  Administered 2016-04-11: 2 g via INTRAVENOUS

## 2016-04-11 MED ORDER — OXYCODONE HCL 5 MG PO TABS
ORAL_TABLET | ORAL | Status: AC
  Filled 2016-04-11: qty 1

## 2016-04-11 MED ORDER — DEXAMETHASONE SODIUM PHOSPHATE 10 MG/ML IJ SOLN
INTRAMUSCULAR | Status: DC | PRN
  Administered 2016-04-11: 10 mg via INTRAVENOUS

## 2016-04-11 SURGICAL SUPPLY — 35 items
ADAPTER SCOPE UROLOK II (MISCELLANEOUS) IMPLANT
BAG DRAIN CYSTO-URO LG1000N (MISCELLANEOUS) ×4 IMPLANT
BASKET ZERO TIP 1.9FR (BASKET) ×4 IMPLANT
CATH URETL 5X70 OPEN END (CATHETERS) ×4 IMPLANT
CNTNR SPEC 2.5X3XGRAD LEK (MISCELLANEOUS) ×6
CONRAY 43 FOR UROLOGY 50M (MISCELLANEOUS) ×4 IMPLANT
CONT SPEC 4OZ STER OR WHT (MISCELLANEOUS) ×2
CONTAINER SPEC 2.5X3XGRAD LEK (MISCELLANEOUS) ×6 IMPLANT
CORD URO TURP 10FT (MISCELLANEOUS) ×4 IMPLANT
DRAPE UTILITY 15X26 TOWEL STRL (DRAPES) ×4 IMPLANT
ELECT REM PT RETURN 9FT ADLT (ELECTROSURGICAL) ×4
ELECTRODE REM PT RTRN 9FT ADLT (ELECTROSURGICAL) ×3 IMPLANT
FIBER LASER LITHO 273 (Laser) ×4 IMPLANT
GLOVE BIO SURGEON STRL SZ 6.5 (GLOVE) ×4 IMPLANT
GLOVE BIO SURGEON STRL SZ7 (GLOVE) ×8 IMPLANT
GOWN STRL REUS W/ TWL LRG LVL3 (GOWN DISPOSABLE) ×6 IMPLANT
GOWN STRL REUS W/TWL LRG LVL3 (GOWN DISPOSABLE) ×2
INTRODUCER DILATOR DOUBLE (INTRODUCER) IMPLANT
KIT RM TURNOVER CYSTO AR (KITS) ×4 IMPLANT
NITINOL WIRE WITH HYDROPHILIC TIP ×4 IMPLANT
PACK CYSTO AR (MISCELLANEOUS) ×4 IMPLANT
PREP PVP WINGED SPONGE (MISCELLANEOUS) ×4 IMPLANT
PROFLEX 273 ×4 IMPLANT
PUMP SINGLE ACTION SAP (PUMP) IMPLANT
SENSORWIRE 0.038 NOT ANGLED (WIRE) ×8
SET CYSTO W/LG BORE CLAMP LF (SET/KITS/TRAYS/PACK) ×4 IMPLANT
SHEATH URETERAL 12FRX35CM (MISCELLANEOUS) IMPLANT
SOL .9 NS 3000ML IRR  AL (IV SOLUTION) ×1
SOL .9 NS 3000ML IRR UROMATIC (IV SOLUTION) ×3 IMPLANT
STENT URET 6FRX24 CONTOUR (STENTS) ×4 IMPLANT
STENT URET 6FRX26 CONTOUR (STENTS) IMPLANT
SURGILUBE 2OZ TUBE FLIPTOP (MISCELLANEOUS) ×4 IMPLANT
WATER STERILE IRR 1000ML POUR (IV SOLUTION) ×4 IMPLANT
WATER STERILE IRR 3000ML UROMA (IV SOLUTION) ×4 IMPLANT
WIRE SENSOR 0.038 NOT ANGLED (WIRE) ×6 IMPLANT

## 2016-04-11 NOTE — Anesthesia Preprocedure Evaluation (Addendum)
Anesthesia Evaluation  Patient identified by MRN, date of birth, ID band Patient awake    Reviewed: Allergy & Precautions, H&P , NPO status , Patient's Chart, lab work & pertinent test results  History of Anesthesia Complications Negative for: history of anesthetic complications  Airway Mallampati: II  TM Distance: >3 FB Neck ROM: full    Dental  (+) Teeth Intact   Pulmonary neg shortness of breath, former smoker,    Pulmonary exam normal breath sounds clear to auscultation       Cardiovascular Exercise Tolerance: Good (-) angina(-) Past MI and (-) DOE negative cardio ROS Normal cardiovascular exam Rhythm:regular Rate:Normal     Neuro/Psych PSYCHIATRIC DISORDERS Anxiety Depression negative neurological ROS     GI/Hepatic Neg liver ROS, GERD  Controlled,  Endo/Other  negative endocrine ROS  Renal/GU Renal disease  negative genitourinary   Musculoskeletal   Abdominal   Peds  Hematology negative hematology ROS (+)   Anesthesia Other Findings Past Medical History:   Vitamin D deficiency                                         Anxiety                                                      Depression                                                   Sleep disturbance                                            Reflux gastritis                                             Vaginal Pap smear, abnormal                                  Kidney stone                                                 GERD (gastroesophageal reflux disease)                      Past Surgical History:   LEEP                                             02/2015       FOOT SURGERY  Left 2010        BMI    Body Mass Index   23.41 kg/m 2      Reproductive/Obstetrics negative OB ROS                            Anesthesia Physical Anesthesia Plan  ASA: III  Anesthesia Plan: General LMA    Post-op Pain Management:    Induction:   Airway Management Planned:   Additional Equipment:   Intra-op Plan:   Post-operative Plan:   Informed Consent: I have reviewed the patients History and Physical, chart, labs and discussed the procedure including the risks, benefits and alternatives for the proposed anesthesia with the patient or authorized representative who has indicated his/her understanding and acceptance.   Dental Advisory Given  Plan Discussed with: Anesthesiologist, CRNA and Surgeon  Anesthesia Plan Comments:         Anesthesia Quick Evaluation

## 2016-04-11 NOTE — Interval H&P Note (Signed)
History and Physical Interval Note:  04/11/2016 11:29 AM  Jasmine Hodges  has presented today for surgery, with the diagnosis of GROSS HEMATURIA NON OPACIFICATION OF RIGHT LOWER URETER  The various methods of treatment have been discussed with the patient and family. After consideration of risks, benefits and other options for treatment, the patient has consented to  Procedure(s): CYSTOSCOPY WITH RETROGRADE PYELOGRAM (Right) CYSTOSCOPY/URETEROSCOPY/HOLMIUM LASER/STENT PLACEMENT (Right) CYSTOSCOPY WITH BIOPSY (N/A) as a surgical intervention .  The patient's history has been reviewed, patient examined, no change in status, stable for surgery.  I have reviewed the patient's chart and labs.  Questions were answered to the patient's satisfaction.    Left ureteroscopy, laser lithotripsy, ureteral stent for left distal ureteral stone  Plan for R RTG to eval and possible intervention as needed.  RRR CTAB  Hollice Espy

## 2016-04-11 NOTE — Discharge Instructions (Signed)
You have a ureteral stent in place.  This is a tube that extends from your kidney to your bladder.  This may cause urinary bleeding, burning with urination, and urinary frequency.  Please call our office or present to the ED if you develop fevers >101 or pain which is not able to be controlled with oral pain medications.  You may be given either Flomax and/ or ditropan to help with bladder spasms and stent pain in addition to pain medications.    You may remove your stent in 3 days by pulling the sting gently  Until the entire stent is removed.  It is normal to have ureteral spasm for ~24 hours after stent removal.  Please be careful using the bathroom to avoid dislodging tube until Thursday.     [facility] [address], [place], [place] [address] [phone]   AMBULATORY SURGERY  DISCHARGE INSTRUCTIONS   1) The drugs that you were given will stay in your system until tomorrow so for the next 24 hours you should not:  A) Drive an automobile B) Make any legal decisions C) Drink any alcoholic beverage   2) You may resume regular meals tomorrow.  Today it is better to start with liquids and gradually work up to solid foods.  You may eat anything you prefer, but it is better to start with liquids, then soup and crackers, and gradually work up to solid foods.   3) Please notify your doctor immediately if you have any unusual bleeding, trouble breathing, redness and pain at the surgery site, drainage, fever, or pain not relieved by medication.

## 2016-04-11 NOTE — Anesthesia Postprocedure Evaluation (Signed)
Anesthesia Post Note  Patient: Jasmine Hodges  Procedure(s) Performed: Procedure(s) (LRB): CYSTOSCOPY WITH RETROGRADE PYELOGRAM (Bilateral) CYSTOSCOPY/URETEROSCOPY/HOLMIUM LASER/STENT PLACEMENT (Left) STONE EXTRACTION WITH BASKET (Left)  Patient location during evaluation: PACU Anesthesia Type: General Level of consciousness: awake and alert Pain management: pain level controlled Vital Signs Assessment: post-procedure vital signs reviewed and stable Respiratory status: spontaneous breathing, nonlabored ventilation, respiratory function stable and patient connected to nasal cannula oxygen Cardiovascular status: blood pressure returned to baseline and stable Postop Assessment: no signs of nausea or vomiting Anesthetic complications: no    Last Vitals:  Filed Vitals:   04/11/16 1349 04/11/16 1400  BP: 117/46 125/53  Pulse: 61 65  Temp: 36.6 C   Resp: 16 16    Last Pain:  Filed Vitals:   04/11/16 1401  PainSc: 5                  Torrion Witter K Jaydeen Odor

## 2016-04-11 NOTE — Transfer of Care (Signed)
Immediate Anesthesia Transfer of Care Note  Patient: Jasmine Hodges  Procedure(s) Performed: Procedure(s): CYSTOSCOPY WITH RETROGRADE PYELOGRAM (Bilateral) CYSTOSCOPY/URETEROSCOPY/HOLMIUM LASER/STENT PLACEMENT (Left) STONE EXTRACTION WITH BASKET (Left)  Patient Location: PACU  Anesthesia Type:General  Level of Consciousness: sedated  Airway & Oxygen Therapy: Patient Spontanous Breathing and Patient connected to face mask oxygen  Post-op Assessment: Report given to RN and Post -op Vital signs reviewed and stable  Post vital signs: Reviewed and stable  Last Vitals:  Filed Vitals:   04/11/16 0941  BP: 101/63  Pulse: 75  Temp: 37 C  Resp: 16    Last Pain:  Filed Vitals:   04/11/16 0947  PainSc: 8          Complications: No apparent anesthesia complications

## 2016-04-11 NOTE — H&P (View-Only) (Signed)
03/23/2016 12:06 PM   Leverne Hodges [birth year] [id number]  Referring provider: [name], NP [place], [place] [address]  Chief Complaint  Patient presents with  . Nephrolithiasis    referred by [facility]    HPI: Patient is a 45 year old Caucasian female referred to [place] by her primary care physician, [name], NP, were a possible left ureteral stone.  Patient states about 1 week ago she had the sudden onset of left-sided back pain with gross hematuria. The pain was intense and then eased off. It then returned to the left groin area and now it feels like a bad toothache.  She has urinary frequency associated with this pain.  She denied fever and chills, but she admitted to nausea and vomiting.  She had a renal ultrasound completed on 03/21/2016 which noted suggestions of a non obstructing stone in the distal left ureter but could not be definitive.  She believes she may have had a stone approximately 14 years ago during a pregnancy, but she never passed a fragment and she states it was not demonstrated on imaging.  She is a former smoker.  UA today was unremarkable.  PMH: Past Medical History  Diagnosis Date  . Vitamin D deficiency   . Anxiety   . Depression   . Sleep disturbance   . Reflux gastritis   . Vaginal Pap smear, abnormal   . Kidney stone     Surgical History: Past Surgical History  Procedure Laterality Date  . Leep  02/2015  . Foot surgery Left 2010    Home Medications:    Medication List       This list is accurate as of: 03/23/16 12:06 PM.  Always use your most recent med list.               ALPRAZolam 0.5 MG tablet  Commonly known as:  XANAX  Take 0.5 mg by mouth at bedtime as needed for anxiety.     ciprofloxacin 500 MG tablet  Commonly known as:  CIPRO  Reported on 03/23/2016     HYDROcodone-acetaminophen 5-325 MG tablet  Commonly known as:  NORCO/VICODIN     Melatonin 3 MG Tabs  Take by mouth.     oxyCODONE-acetaminophen 10-325 MG tablet  Commonly known as:  PERCOCET  Take 1 tablet by mouth every 4 (four) hours as needed for pain.     tamsulosin 0.4 MG Caps capsule  Commonly known as:  FLOMAX  Take 1 capsule (0.4 mg total) by mouth daily. Reported on 03/23/2016        Allergies: No Known Allergies  Family History: Family History  Problem Relation Age of Onset  . Lung cancer Mother   . Hypertension Father   . Kidney disease Neg Hx   . Bladder Cancer Neg Hx   . Kidney Stones Father     Social History:  reports that she has quit smoking. She has never used smokeless tobacco. She reports that she drinks alcohol. She reports that she does not use illicit drugs.  ROS: UROLOGY Frequent Urination?: Yes Hard to postpone urination?: No Burning/pain with urination?: No Get up at night to urinate?: No Leakage of urine?: No Urine stream starts and stops?: No Trouble starting stream?: No Do you have to strain to urinate?: No Blood in urine?: No Urinary tract infection?: No Sexually transmitted disease?: No Injury to kidneys or bladder?: No Painful intercourse?: No Weak stream?: No Currently pregnant?: No Vaginal bleeding?: No Last menstrual  period?: 0414/2017  Gastrointestinal Nausea?: No Vomiting?: No Indigestion/heartburn?: No Diarrhea?: No Constipation?: No  Constitutional Fever: No Night sweats?: No Weight loss?: No Fatigue?: No  Skin Skin rash/lesions?: No Itching?: No  Eyes Blurred vision?: No Double vision?: No  Ears/Nose/Throat Sore throat?: No Sinus problems?: No  Hematologic/Lymphatic Swollen glands?: No Easy bruising?: No  Cardiovascular Leg swelling?: No Chest pain?: No  Respiratory Cough?: No Shortness of breath?: No  Endocrine Excessive thirst?: No  Musculoskeletal Back pain?: No Joint pain?: No  Neurological Headaches?: No Dizziness?: No  Psychologic Depression?: No Anxiety?: No  Physical Exam: BP 93/56 mmHg   Pulse 56  Ht 5\' 6"  (1.676 m)  Wt 147 lb 14.4 oz (67.087 kg)  BMI 23.88 kg/m2  LMP 03/18/2016  Constitutional: Well nourished. Alert and oriented, No acute distress. HEENT: [place] AT, moist mucus membranes. Trachea midline, no masses. Cardiovascular: No clubbing, cyanosis, or edema. Respiratory: Normal respiratory effort, no increased work of breathing. GI: Abdomen is soft, non tender, non distended, no abdominal masses. Liver and spleen not palpable.  No hernias appreciated.  Stool sample for occult testing is not indicated.   GU: No CVA tenderness.  No bladder fullness or masses.   Skin: No rashes, bruises or suspicious lesions. Lymph: No cervical or inguinal adenopathy. Neurologic: Grossly intact, no focal deficits, moving all 4 extremities. Psychiatric: Normal mood and affect.  Laboratory Data: Lab Results  Component Value Date   WBC 8.2 09/19/2013   HGB 13.4 09/19/2013   HCT 37.6 09/19/2013   MCV 88 09/19/2013   PLT 201 09/19/2013    Lab Results  Component Value Date   CREATININE 0.85 09/19/2013     Lab Results  Component Value Date   TSH 1.62 11/16/2014       Component Value Date/Time   CHOL 184 11/16/2014   HDL 80* 11/16/2014   LDLCALC 87 11/16/2014    Lab Results  Component Value Date   AST 26 09/19/2013   Lab Results  Component Value Date   ALT 21 09/19/2013   Urinalysis Results for orders placed or performed in visit on 03/23/16  Microscopic Examination  Result Value Ref Range   WBC, UA 0-5 0 -  5 /hpf   RBC, UA None seen 0 -  2 /hpf   Epithelial Cells (non renal) 0-10 0 - 10 /hpf   Bacteria, UA None seen None seen/Few  Urinalysis, Complete  Result Value Ref Range   Specific Gravity, UA <1.005 (L) 1.005 - 1.030   pH, UA 7.0 5.0 - 7.5   Color, UA Yellow Yellow   Appearance Ur Clear Clear   Leukocytes, UA Negative Negative   Protein, UA Negative Negative/Trace   Glucose, UA Negative Negative   Ketones, UA Negative Negative   RBC, UA Negative  Negative   Bilirubin, UA Negative Negative   Urobilinogen, Ur 0.2 0.2 - 1.0 mg/dL   Nitrite, UA Negative Negative   Microscopic Examination See below:    Pertinent Imaging: CLINICAL DATA: Left flank pain.  EXAM: RENAL / URINARY TRACT ULTRASOUND COMPLETE  COMPARISON: None  FINDINGS: Right Kidney:  Length: 10.3 cm. Slight fullness of the right renal pelvis without a visible stone. Echogenicity within normal limits. No mass or hydronephrosis visualized.  Left Kidney:  Length: 10.0 cm. Bilateral ureteral jets identified.  Bladder:  Appears normal for degree of bladder distention. On image 35 of 36 there is suggestion of a 5 mm stone in the distal left ureter but this is not definitive and  the ureter certainly patent.  IMPRESSION: Suggestion of a nonobstructing stone in the distal left ureter but this is not definitive.   Electronically Signed  By: Lorriane Shire M.D.  On: 03/21/2016 16:26   Assessment & Plan:    1. Kidney stones:   Patient had findings suggestive of a kidney stone on renal ultrasound, but it is not definitive.  We'll obtain a CT urogram at this time.  I explained to the patient that a CT urogram was needed at this time so that we could clearly identify a stone if it is present.  It will also help [place] decide what definitive treatment would give her the best success of ridding her of the stone if one is present.  I also explained because of the blood in the urine undergoing a hematuria workup at this time is prudent especially with her history of smoking.  2. Gross hematuria:   Explained to patient the causes of blood in the urine are as follows: stones, UTI's, damage to the urinary tract and/or cancer.   It is explained to the patient that they will be scheduled for a CT Urogram with contrast material and that in rare instances, an allergic reaction can be serious and even life threatening with the injection of contrast material.   The patient  denies any allergies to contrast, iodine and/or seafood and is not taking metformin.  - Urinalysis, Complete - CULTURE, URINE COMPREHENSIVE  3. Left flank pain:  Patient was given Percocet 10/325  #10.  A refill is given for her tamsulosin.  She does have a strainer and will continue to strain her urine.  Contact us if she passes a stone.  If she should experience fevers/chills, intractable vomiting or pain, she should contact the office immediately or seek treatment in the emergency room.  CT urogram is pending at this time.   Return for I will call the patient with results.  These notes generated with voice recognition software. I apologize for typographical errors.  Zara Council, [facility] [address], [place], [place] [address] [phone]

## 2016-04-11 NOTE — Anesthesia Procedure Notes (Signed)
Procedure Name: LMA Insertion Date/Time: 04/11/2016 11:49 AM Performed by: Nelda Marseille Pre-anesthesia Checklist: Patient identified, Patient being monitored, Timeout performed, Emergency Drugs available and Suction available Patient Re-evaluated:Patient Re-evaluated prior to inductionOxygen Delivery Method: Circle system utilized Preoxygenation: Pre-oxygenation with 100% oxygen Intubation Type: IV induction Ventilation: Mask ventilation without difficulty LMA: LMA inserted LMA Size: 3.5 Tube type: Oral Number of attempts: 1 Placement Confirmation: positive ETCO2 and breath sounds checked- equal and bilateral Tube secured with: Tape Dental Injury: Teeth and Oropharynx as per pre-operative assessment

## 2016-04-11 NOTE — Op Note (Signed)
Date of procedure: 04/11/2016  Preoperative diagnosis:  1. Left ureteral stones 2. Hematuria   Postoperative diagnosis:  1. Same as above   Procedure: 1. Bilateral retrograde pyelogram 2. Left ureteroscopy 3. Left laser lithotripsy 4. Left ureteral stent placement 5. Basket extraction of left stone fragment  Surgeon: Hollice Espy, MD  Anesthesia: General  Complications: None  Intraoperative findings: 7 mm left ureteral stone found at the mid ureter level. Stone was treated. Bilateral retrogrades normal.  EBL: Minimal  Specimens: Stone fragment  Drains: 6 x 24 French double-J ureteral stent on left (string left in place)  Indication: Jasmine Hodges is a 45 y.o. patient with 7 mm left distal ureteral stone and pain.  She previously underwent CT urogram with incomplete delayed nephrogram on the right therefore is counseled to undergo bilateral retrograde pyelogram along with cystoscopy to complete her hematuria workup.  After reviewing the management options for treatment, she elected to proceed with the above surgical procedure(s). We have discussed the potential benefits and risks of the procedure, side effects of the proposed treatment, the likelihood of the patient achieving the goals of the procedure, and any potential problems that might occur during the procedure or recuperation. Informed consent has been obtained.  Description of procedure:  The patient was taken to the operating room and general anesthesia was induced.  The patient was placed in the dorsal lithotomy position, prepped and draped in the usual sterile fashion, and preoperative antibiotics were administered. A preoperative time-out was performed.   This point in time, a rigid 21 [place] scope was advanced per urethra into the bladder. The bladder was carefully inspected which was noted to be completely normal without any mucosal lesions, stones, or ulcerations. The trigone was normal with clear reflux of urine  from both. Attention was then turned to the right ureteral orifice which was able to be easily cannulated using a 5 [place] open-ended ureteral catheter. Gentle retropyelogram was performed which revealed a delicate appearing ureter and upper tract collecting system without filling defects or hydronephrosis. Attention was then turned to the left ureteral orifice which was also cannulated using a 5 [place] open-ended ureteral catheter and retrograde polygrams performed. There is no evidence of hydronephrosis on this side as well and no obvious filling defect. Given her history of continued pain, I did go ahead and plan for ureteroscopy on the side. A sensor wire was then placed up to level of the kidney under fluoroscopic guidance without difficulty. The wire was snapped in place. A 4.5 French needle Wolf semirigid short ureteroscope was then advanced alongside the wire within the distal ureter all the way up to the mid ureter at which time a stone was identified. A 1.9 French nitinol basket was then used to grasp the stone and bring it down to the level of the distal ureter for easier fragmentation and basket extraction without concern for retropulsion. A 200  was a fibrous and brought in and using settings of 0.8 J and 10 Hz, the stone was broken into proximally 5-6 smaller pieces. Each of these were then basket extracted using a 1.9 [place] nitinol basket. There was some mild edema within the distal ureter with the stone had presumably been previously large. There is no other ureteral trauma noted. The scope was then advanced all the way up to level of the proximal ureter and there was no residual stone fragments.  The scope was then removed and a 6 x 24 French double-J ureteral stent was advanced over  the wire up to level of the kidney. The wire was then partially drawn until full coil was noted within the renal pelvis. The wire was then fully withdrawn until full coil was noted within the renal pelvis. The patient  was then cleaned and dried. The stent string was affixed to the patient's left inner thigh using Mastisol and Tegaderm. She was then reversed from anesthesia and taken the PACU in stable condition. There were no indications of this case.  Plan: Patient will remove her own stent later this week. She'll follow-up in 4 weeks with a renal ultrasound prior. At that time, stone analysis will be reviewed.  Hollice Espy, M.D.

## 2016-04-12 ENCOUNTER — Other Ambulatory Visit: Payer: Self-pay

## 2016-04-12 ENCOUNTER — Telehealth: Payer: Self-pay | Admitting: Urology

## 2016-04-12 DIAGNOSIS — R109 Unspecified abdominal pain: Secondary | ICD-10-CM

## 2016-04-12 MED ORDER — OXYCODONE-ACETAMINOPHEN 10-325 MG PO TABS: 1.0000 | ORAL_TABLET | ORAL | Status: DC | PRN

## 2016-04-12 NOTE — Telephone Encounter (Signed)
Patient called and said that she has had to take her pain meds every 4 hours and is in a lot of pain and will run out by the morning. She can not get comfortable and wants to know if she can get another script for more pain pi;;s today.  Please advise   Sharyn Lull

## 2016-04-12 NOTE — Progress Notes (Signed)
Pt was given 10 more tabs of percocet per Dr. Erlene Quan.

## 2016-04-13 ENCOUNTER — Telehealth: Payer: Self-pay

## 2016-04-13 NOTE — Telephone Encounter (Signed)
Pt husband called stating over night pt passed a dime size clot and voiced concern. Husband stated that pt was in severe pain and once clot was passed the pain went away. Reinforced with husband for pt to continue to push fluids to help pass any clots/stone fragments as bleeding is ok as long as pt can continue to urinate. Also reinforced with husband should clots become larger than a quarter to give [place] a call back. Husband voiced understanding of whole conversation.

## 2016-04-14 ENCOUNTER — Telehealth: Payer: Self-pay

## 2016-04-14 NOTE — Telephone Encounter (Signed)
Pt husband, Shon Baton, called stating pt was supposed to take stent out this morning but removed stent last night "in hopes of being able to sleep". Per Shon Baton pt was not able to sleep at all last nite due to kidney pain. Shon Baton wanted to know what else pt could do/take for pain. Reinforced with Shon Baton pt could have residual pain and swelling for the next 24hrs but should start do subside as the day goes on. Also reinforced with Shon Baton for pt to alternate ibuprofen and tylenol. Shon Baton voiced understanding.

## 2016-04-18 LAB — STONE ANALYSIS
CA OXALATE, MONOHYDR.: 75 %
Ca Oxalate,Dihydrate: 5 %
Ca phos cry stone ql IR: 20 %
STONE WEIGHT KSTONE: 8 mg

## 2016-04-19 ENCOUNTER — Other Ambulatory Visit: Payer: Self-pay | Admitting: Urology

## 2016-04-19 ENCOUNTER — Telehealth: Payer: Self-pay

## 2016-04-19 ENCOUNTER — Ambulatory Visit (INDEPENDENT_AMBULATORY_CARE_PROVIDER_SITE_OTHER): Payer: BC Managed Care – PPO

## 2016-04-19 DIAGNOSIS — R300 Dysuria: Secondary | ICD-10-CM

## 2016-04-19 DIAGNOSIS — R109 Unspecified abdominal pain: Secondary | ICD-10-CM

## 2016-04-19 LAB — URINALYSIS, COMPLETE
BILIRUBIN UA: NEGATIVE
Glucose, UA: NEGATIVE
KETONES UA: NEGATIVE
Leukocytes, UA: NEGATIVE
NITRITE UA: NEGATIVE
PH UA: 6 (ref 5.0–7.5)
Protein, UA: NEGATIVE
SPEC GRAV UA: 1.015 (ref 1.005–1.030)
Urobilinogen, Ur: 0.2 mg/dL (ref 0.2–1.0)

## 2016-04-19 LAB — MICROSCOPIC EXAMINATION: Bacteria, UA: NONE SEEN

## 2016-04-19 LAB — BLADDER SCAN AMB NON-IMAGING: SCAN RESULT: 34

## 2016-04-19 MED ORDER — KETOROLAC TROMETHAMINE 10 MG PO TABS: 10.0000 mg | ORAL_TABLET | Freq: Four times a day (QID) | ORAL | Status: DC | PRN

## 2016-04-19 MED ORDER — TAMSULOSIN HCL 0.4 MG PO CAPS: 0.4000 mg | ORAL_CAPSULE | Freq: Every day | ORAL | Status: DC

## 2016-04-19 NOTE — Progress Notes (Signed)
Pt complained of left flank pain that comes on 20 minutes after eating grapes. The pt states the pain last for about 1hours and then subsided.

## 2016-04-19 NOTE — Telephone Encounter (Signed)
Pt husband, Shon Baton, called stating pt has been eating healthier including to be more protein, ruffage, and fruits. Per husband when pt eats she develops severe stomach pains. Husband was requesting more pain medication for pt. Per Larene Beach pt can come in for a u/a and bladder scan, but no pain medication was to be given. Larene Beach stated sounded like pt should have a good BM or the discomfort is more GI related than bladder spasms. Made Watson aware of Shannon's orders. Shon Baton voiced understanding stating he will call back during pt lunch hour.

## 2016-04-19 NOTE — Telephone Encounter (Signed)
Spoke with pt in reference to pain in kidney/bladder area after eating meals. Made pt aware Jasmine Hodges stated she should not be eating a lot of protein and sounds like GI distress instead of kidney/bladder problems. Pt also stated that her BM have been really loose since Sunday but she has been eating very fresh foods along with taking Miralax. Reinforced with pt to stop the Miralax and change her diet to help settle her stomach. Offered for pt to come give a urine for u/a and PVR. Pt voiced understanding of whole conversation and will RTC today for u/a and PVR.

## 2016-04-21 ENCOUNTER — Encounter: Payer: Self-pay | Admitting: Obstetrics and Gynecology

## 2016-04-21 ENCOUNTER — Ambulatory Visit
Admission: RE | Admit: 2016-04-21 | Discharge: 2016-04-21 | Disposition: A | Discharge status: Home / Self Care | Payer: BC Managed Care – PPO | Source: Ambulatory Visit | Attending: Urology | Admitting: Urology

## 2016-04-21 ENCOUNTER — Ambulatory Visit (INDEPENDENT_AMBULATORY_CARE_PROVIDER_SITE_OTHER): Payer: BC Managed Care – PPO | Admitting: Obstetrics and Gynecology

## 2016-04-21 VITALS — BP 103/65 | HR 64 | Ht 66.0 in | Wt 147.8 lb

## 2016-04-21 DIAGNOSIS — R109 Unspecified abdominal pain: Secondary | ICD-10-CM | POA: Diagnosis present

## 2016-04-21 DIAGNOSIS — N1330 Unspecified hydronephrosis: Secondary | ICD-10-CM | POA: Diagnosis not present

## 2016-04-21 DIAGNOSIS — D069 Carcinoma in situ of cervix, unspecified: Secondary | ICD-10-CM

## 2016-04-21 DIAGNOSIS — R896 Abnormal cytological findings in specimens from other organs, systems and tissues: Secondary | ICD-10-CM

## 2016-04-21 DIAGNOSIS — Z9889 Other specified postprocedural states: Secondary | ICD-10-CM | POA: Diagnosis not present

## 2016-04-21 DIAGNOSIS — IMO0002 Reserved for concepts with insufficient information to code with codable children: Secondary | ICD-10-CM

## 2016-04-21 NOTE — Progress Notes (Signed)
    GYNECOLOGY CLINIC PROGRESS NOTE  Subjective:     Jasmine Hodges is a 45 y.o. [phone] woman who comes in today for a  pap smear only. Her most recent annual exam was on 11/2014. 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 Contraception: tubal ligation  The following portions of the patient's history were reviewed and updated as appropriate: allergies, current medications, past family history, past medical history, past social history, past surgical history and problem list.  Review of Systems A comprehensive review of systems was negative except for: Genitourinary: positive for recent diagnosis of kidney stones, had surgery 04/11/2016   Objective:    BP 103/65 mmHg  Pulse 64  Ht 5\' 6"  (1.676 m)  Wt 147 lb 12.8 oz (67.042 kg)  BMI 23.87 kg/m2  LMP 04/12/2016 Pelvic Exam: cervix normal in appearance, external genitalia normal and vagina normal without discharge. Pap smear obtained.   Assessment:   Screening pap smear.   H/o abnormal pap smears H/o LEEP  Plan:    Follow up in 6 months for annual exam (+/- repeat pap), or as indicated by Pap results.    Rubie Maid, MD [facility]

## 2016-04-22 ENCOUNTER — Telehealth: Payer: Self-pay | Admitting: Urology

## 2016-04-22 ENCOUNTER — Other Ambulatory Visit: Payer: Self-pay | Admitting: Urology

## 2016-04-22 DIAGNOSIS — N132 Hydronephrosis with renal and ureteral calculous obstruction: Secondary | ICD-10-CM

## 2016-04-22 NOTE — Telephone Encounter (Signed)
Dr. Cherrie Gauze will be fine.

## 2016-04-22 NOTE — Telephone Encounter (Signed)
Do you know why there is another order for a RUS for this patient from you? Dr. Erlene Quan already has an order for one for her post op appt in June.  She just had one from you on 04-21-16. Just trying to get clarification  Thanks,  michelle

## 2016-04-22 NOTE — Telephone Encounter (Signed)
She needs the one in June and I don't want people to think that it was already done because she just had one.  This one is to be done before her appointment on the 15th.

## 2016-04-22 NOTE — Telephone Encounter (Signed)
Ok so does it Research officer, trade [employer] which order they use?  Because they know that she still needs the one dr. Erlene Quan put in.

## 2016-04-26 LAB — PAP IG AND HPV HIGH-RISK
HPV, HIGH-RISK: NEGATIVE
PAP Smear Comment: 0

## 2016-04-29 ENCOUNTER — Telehealth: Payer: Self-pay

## 2016-04-29 NOTE — Telephone Encounter (Signed)
Called pt informed her of the information below and the need to schedule colpo, pt gave verbal understanding and states she will call back after her she gets off work to schedule appt.

## 2016-04-29 NOTE — Telephone Encounter (Signed)
-----   Message from Rubie Maid, MD sent at 04/29/2016  7:34 AM EDT ----- Please inform of low grade pap smear. Will need colposcopy again.

## 2016-05-19 ENCOUNTER — Ambulatory Visit: Payer: BC Managed Care – PPO | Admitting: Urology

## 2016-05-20 ENCOUNTER — Ambulatory Visit
Admission: RE | Admit: 2016-05-20 | Discharge: 2016-05-20 | Disposition: A | Discharge status: Home / Self Care | Payer: BC Managed Care – PPO | Source: Ambulatory Visit | Attending: Urology | Admitting: Urology

## 2016-05-20 DIAGNOSIS — R109 Unspecified abdominal pain: Secondary | ICD-10-CM | POA: Diagnosis present

## 2016-05-25 ENCOUNTER — Encounter: Payer: Self-pay | Admitting: Urology

## 2016-05-25 ENCOUNTER — Ambulatory Visit (INDEPENDENT_AMBULATORY_CARE_PROVIDER_SITE_OTHER): Payer: BC Managed Care – PPO | Admitting: Urology

## 2016-05-25 VITALS — BP 108/73 | HR 73 | Ht 66.0 in | Wt 150.0 lb

## 2016-05-25 DIAGNOSIS — R310 Gross hematuria: Secondary | ICD-10-CM | POA: Diagnosis not present

## 2016-05-25 DIAGNOSIS — N200 Calculus of kidney: Secondary | ICD-10-CM | POA: Diagnosis not present

## 2016-05-25 NOTE — Progress Notes (Signed)
05/25/2016 3:05 PM   [name] July 19, 1971 [id number]  Referring provider: [name], NP [address] [place], [place] [address]  Chief Complaint  Patient presents with  . Results    HPI: 45 year old female with left 7 mm mid ureteral stone status post left ureteroscopy, laser lithotripsy, left ureteral stent placement of 04/11/2016. She also has a history of hematuria and therefore cystoscopy and bilateral retrograde pyelogram were completed at the time of surgery to complete this workup. Surgery was successful and a left ureteral stent was left in place with a string.   She did have some issues postoperatively with left flank pain.   As part of workup for this discomfort, she underwent a renal ultrasound on 04/21/2016 just 10 days following the procedure. She did have some residual mild left hydronephrosis at that time.  Her flank pain has since completely resolved. She did undergo a repeat renal ultrasound on 05/20/2016 which showed no residual hydro-nephrosis.  No urinary complaints today. No dysuria, gross hematuria, flank pain, fevers, chills, or any other symptoms.  Stone analysis shows 75% calcium oxalate monohydrate, 5% calcium oxalate dihydrate, and 20% calcium phosphate.  She may have had another stone episode approximately 14 years ago during pregnancy but this was never confirmed. Otherwise, no previous stone episodes.   PMH: Past Medical History  Diagnosis Date  . Vitamin D deficiency   . Anxiety   . Depression   . Sleep disturbance   . Reflux gastritis   . Vaginal Pap smear, abnormal   . Kidney stone   . GERD (gastroesophageal reflux disease)   . History of abnormal cells from cervix   . CIN II (cervical intraepithelial neoplasia II)   . LGSIL (low grade squamous intraepithelial dysplasia)     Surgical History: Past Surgical History  Procedure Laterality Date  . [place]  02/2015  . Foot surgery Left 2010  . Cystoscopy w/ retrogrades Bilateral  04/11/2016    Procedure: CYSTOSCOPY WITH RETROGRADE PYELOGRAM;  Surgeon: Hollice Espy, MD;  Location: [facility];  Service: Urology;  Laterality: Bilateral;  . Cystoscopy/ureteroscopy/holmium laser/stent placement Left 04/11/2016    Procedure: CYSTOSCOPY/URETEROSCOPY/HOLMIUM LASER/STENT PLACEMENT;  Surgeon: Hollice Espy, MD;  Location: [facility];  Service: Urology;  Laterality: Left;  . Stone extraction with basket Left 04/11/2016    Procedure: STONE EXTRACTION WITH BASKET;  Surgeon: Hollice Espy, MD;  Location: [facility];  Service: Urology;  Laterality: Left;  . Colposcopy w/ biopsy / curettage      Home Medications:    Medication List       This list is accurate as of: 05/25/16 11:59 PM.  Always use your most recent med list.               ALPRAZolam 0.5 MG tablet  Commonly known as:  XANAX  Take 0.5 mg by mouth at bedtime as needed for anxiety.     Melatonin 10 MG Tabs  Take 1 tablet by mouth at bedtime.        Allergies: No Known Allergies  Family History: Family History  Problem Relation Age of Onset  . Lung cancer Mother   . Hypertension Father   . Kidney disease Neg Hx   . Bladder Cancer Neg Hx   . Kidney Stones Father     Social History:  reports that she quit smoking about 3 years ago. She has never used smokeless tobacco. She reports that she drinks about 1.8 oz of alcohol per week. She reports that she does  not use illicit drugs.  ROS: UROLOGY Frequent Urination?: No Hard to postpone urination?: No Burning/pain with urination?: No Get up at night to urinate?: No Leakage of urine?: No Urine stream starts and stops?: No Trouble starting stream?: No Do you have to strain to urinate?: No Blood in urine?: No Urinary tract infection?: No Sexually transmitted disease?: No Injury to kidneys or bladder?: No Painful intercourse?: No Weak stream?: No Currently pregnant?: No Vaginal bleeding?: No Last menstrual period?: n  Gastrointestinal Nausea?:  No Vomiting?: No Indigestion/heartburn?: No Diarrhea?: No Constipation?: No  Constitutional Fever: No Night sweats?: No Weight loss?: No Fatigue?: No  Skin Skin rash/lesions?: No Itching?: No  Eyes Blurred vision?: No Double vision?: No  Ears/Nose/Throat Sore throat?: No Sinus problems?: No  Hematologic/Lymphatic Swollen glands?: No Easy bruising?: No  Cardiovascular Leg swelling?: No Chest pain?: No  Respiratory Cough?: No Shortness of breath?: No  Endocrine Excessive thirst?: No  Musculoskeletal Back pain?: No Joint pain?: No  Neurological Headaches?: No Dizziness?: No  Psychologic Depression?: No Anxiety?: No  Physical Exam: BP 108/73 mmHg  Pulse 73  Ht 5\' 6"  (1.676 m)  Wt 150 lb (68.04 kg)  BMI 24.22 kg/m2  LMP 05/11/2016 (Approximate)  Constitutional:  Alert and oriented, No acute distress. HEENT: [place] AT, moist mucus membranes.  Trachea midline, no masses. Cardiovascular: No clubbing, cyanosis, or edema. Respiratory: Normal respiratory effort, no increased work of breathing. GI: Abdomen is soft, nontender, nondistended, no abdominal masses GU: No CVA tenderness.  Skin: No rashes, bruises or suspicious lesions. Neurologic: Grossly intact, no focal deficits, moving all 4 extremities. Psychiatric: Normal mood and affect.  Laboratory Data: Lab Results  Component Value Date   WBC 8.3 03/30/2016   HGB 13.1 03/30/2016   HCT 38.6 03/30/2016   MCV 88.1 03/30/2016   PLT 209 03/30/2016    Lab Results  Component Value Date   CREATININE 0.82 03/30/2016    Pertinent Imaging: Study Result     CLINICAL DATA: LEFT renal calculus post stent placement 04/11/2016, symptomatically improved, followup  EXAM: RENAL / URINARY TRACT ULTRASOUND COMPLETE  COMPARISON: 04/21/2016  FINDINGS: Right Kidney:  Length: 10.9 cm. Normal cortical thickness and echogenicity. Minimal renal pelvic fullness. No mass, hydronephrosis or  shadowing calcification.  Left Kidney:  Length: 10.8 cm. Normal cortical thickness and echogenicity. No mass, hydronephrosis or shadowing calcification.  Bladder:  Appears normal for degree of bladder distention.  IMPRESSION: Minimal RIGHT renal pelvic fullness.  No evidence of hydronephrosis or renal mass.   Electronically Signed  By: Lavonia Dana M.D.  On: 05/20/2016 17:06     Assessment & Plan:    1. Nephrolithiasis S/p uncomplicated left ureteroscopy for obstructing 7 mm uretearl stone.  No residual hydronephrosis or stones on RUS.    Stone analysis reviewed.  We discussed general stone prevention techniques including drinking plenty water with goal of producing 2.5 L urine daily, increased citric acid intake, avoidance of high oxalate containing foods, and decreased salt intake.  Information about dietary recommendations given today.    2. Hematuria S/p negative work up including CT abd/ pelvis and bilateral RTG   Return if symptoms worsen or fail to improve.  Hollice Espy, MD  [facility] [address] [place], [place] [address] [phone]

## 2016-05-31 ENCOUNTER — Encounter: Payer: Self-pay | Admitting: Obstetrics and Gynecology

## 2016-05-31 ENCOUNTER — Ambulatory Visit (INDEPENDENT_AMBULATORY_CARE_PROVIDER_SITE_OTHER): Payer: BC Managed Care – PPO | Admitting: Obstetrics and Gynecology

## 2016-05-31 VITALS — BP 105/68 | HR 76 | Ht 66.0 in | Wt 148.7 lb

## 2016-05-31 DIAGNOSIS — R896 Abnormal cytological findings in specimens from other organs, systems and tissues: Secondary | ICD-10-CM

## 2016-05-31 DIAGNOSIS — Z9889 Other specified postprocedural states: Secondary | ICD-10-CM

## 2016-05-31 DIAGNOSIS — IMO0002 Reserved for concepts with insufficient information to code with codable children: Secondary | ICD-10-CM

## 2016-05-31 NOTE — Addendum Note (Signed)
Addended by: Donalee Citrin on: 05/31/2016 04:57 PM   Modules accepted: Orders

## 2016-05-31 NOTE — Progress Notes (Signed)
    GYNECOLOGY CLINIC COLPOSCOPY PROCEDURE NOTE  45 y.o. G2P0 here for colposcopy for low-grade squamous intraepithelial neoplasia (LGSIL - encompassing HPV,mild dysplasia,CIN I) pap smear on 04/21/16. Discussed role for HPV in cervical dysplasia, need for surveillance.  Patient is with a h/o abnormal pap smears.   Patient given informed consent, signed copy in the chart, time out was performed.  Placed in lithotomy position. Cervix viewed with speculum and colposcope after application of acetic acid.   Colposcopy adequate? 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  ECC specimen obtained (using cytobrush only due to stenosis). All specimens were labeled and sent to pathology.  Patient was given post procedure instructions.  Will follow up pathology and manage accordingly.  Routine preventative health maintenance measures emphasized.    Rubie Maid, MD [facility]

## 2016-06-03 ENCOUNTER — Telehealth: Payer: Self-pay

## 2016-06-03 LAB — PATHOLOGY

## 2016-06-03 NOTE — Telephone Encounter (Signed)
Called pt informed her of negative biopsy results and pap in 34yr pt gave verbal understanding

## 2016-06-03 NOTE — Telephone Encounter (Signed)
-----   Message from Rubie Maid, MD sent at 06/03/2016 12:33 PM EDT ----- Please inform patient that biopsies were negative.  Can f/u in 1 year with pap smear.

## 2016-06-08 ENCOUNTER — Encounter: Payer: Self-pay | Admitting: Obstetrics and Gynecology

## 2016-06-12 ENCOUNTER — Encounter: Payer: Self-pay | Admitting: Urology

## 2016-06-15 ENCOUNTER — Encounter: Payer: Self-pay | Admitting: Obstetrics and Gynecology

## 2016-12-06 ENCOUNTER — Other Ambulatory Visit: Payer: Self-pay | Admitting: Nurse Practitioner

## 2016-12-06 DIAGNOSIS — Z1231 Encounter for screening mammogram for malignant neoplasm of breast: Secondary | ICD-10-CM

## 2017-01-03 ENCOUNTER — Ambulatory Visit
Admission: RE | Admit: 2017-01-03 | Discharge: 2017-01-03 | Disposition: A | Discharge status: Home / Self Care | Payer: BC Managed Care – PPO | Source: Ambulatory Visit | Attending: Nurse Practitioner | Admitting: Nurse Practitioner

## 2017-01-03 ENCOUNTER — Other Ambulatory Visit: Payer: Self-pay | Admitting: Nurse Practitioner

## 2017-01-03 DIAGNOSIS — Z1231 Encounter for screening mammogram for malignant neoplasm of breast: Secondary | ICD-10-CM | POA: Diagnosis present

## 2017-02-06 ENCOUNTER — Telehealth: Payer: Self-pay | Admitting: Obstetrics and Gynecology

## 2017-02-06 NOTE — Telephone Encounter (Signed)
Yes we need to see this pt earlier for evaluation of her current sx, please have pt scheduled in next available slot. Thanks

## 2017-02-06 NOTE — Telephone Encounter (Signed)
LVM for pt to call back and schedule appt. 

## 2017-02-06 NOTE — Telephone Encounter (Signed)
Patient said that at her last pap she was told that she could wait until her yearly unless she was having problems. Patient states that she's having watery discharge that is pink and reddish, and that she is bleeding after intercourse. Patient was wondering if she needs to be seen before her annual in June or if she can wait til then. Please advise

## 2017-02-07 NOTE — Telephone Encounter (Signed)
Appt scheduled for 02/15/17

## 2017-02-15 ENCOUNTER — Encounter: Payer: Self-pay | Admitting: Obstetrics and Gynecology

## 2017-02-15 ENCOUNTER — Ambulatory Visit (INDEPENDENT_AMBULATORY_CARE_PROVIDER_SITE_OTHER): Payer: BC Managed Care – PPO | Admitting: Obstetrics and Gynecology

## 2017-02-15 VITALS — BP 95/64 | HR 71 | Ht 66.0 in | Wt 151.7 lb

## 2017-02-15 DIAGNOSIS — N930 Postcoital and contact bleeding: Secondary | ICD-10-CM | POA: Diagnosis not present

## 2017-02-15 DIAGNOSIS — R5381 Other malaise: Secondary | ICD-10-CM | POA: Diagnosis not present

## 2017-02-15 DIAGNOSIS — E559 Vitamin D deficiency, unspecified: Secondary | ICD-10-CM

## 2017-02-15 DIAGNOSIS — N898 Other specified noninflammatory disorders of vagina: Secondary | ICD-10-CM

## 2017-02-15 DIAGNOSIS — R5383 Other fatigue: Secondary | ICD-10-CM

## 2017-02-15 MED ORDER — VITAMIN D (ERGOCALCIFEROL) 1.25 MG (50000 UNIT) PO CAPS: 50000.0000 [IU] | ORAL_CAPSULE | ORAL | 0 refills | Status: DC

## 2017-02-15 NOTE — Progress Notes (Signed)
    GYNECOLOGY CLINIC PROGRESS NOTE  Subjective:     Jasmine Hodges is a 46 y.o. G24P2002 female with h/o cervical dysplasia (s/p LEEP 2016) who presents for evaluation of an abnormal vaginal discharge. Symptoms have been present for 3-4 months. Vaginal symptoms: abnormal bleeding: postcoital bleeding x 2-3 months and discharge described as watery, pink-tinged x 3-4 months. Contraception: IUD. She denies burning, odor, vulvar itching and warts Sexually transmitted infection risk: very low risk of STD exposure. Menstrual flow: rare due to IUD.  The following portions of the patient's history were reviewed and updated as appropriate: allergies, current medications, past family history, past medical history, past social history, past surgical history and problem list.   Review of Systems A comprehensive review of systems was negative except for: Constitutional: positive for fatigue.  Notes that this has been ongoing for quite some time (at least 3-4 months).  Is taking her Vitamin D as prescribed as well as other energy supplements but still feels drained.    Objective:    BP 95/64 (BP Location: Left Arm, Patient Position: Sitting, Cuff Size: Normal)   Pulse 71   Ht 5\' 6"  (1.676 m)   Wt 151 lb 11.2 oz (68.8 kg)   BMI 24.49 kg/m  General appearance: alert and no distress Abdomen: soft, non-tender; bowel sounds normal; no masses,  no organomegaly Pelvic: external genitalia normal, rectovaginal septum normal.  Vagina without discharge.  Cervix with stenotic appearing os (s/p LEEP changes), no lesions and no motion tenderness.  IUD strings not visible.  Uterus mobile, nontender, normal shape and size.  Adnexae non-palpable, nontender bilaterally.  Extremities: extremities normal, atraumatic, no cyanosis or edema Neurologic: Grossly normal    Assessment:   Post-coital bleeding Fatigue H/o cervical dysplasia Lost IUD threads   Plan:   1. Post-coital bleeding.  No evidence of cervical  lesions. No vaginal discharge obvious.  Unsure cause. Performed Nuswab to r/o occult vaginitis/cervicitis.  If negative, will consider pelvic ultrasound to r/o endocervical polyps, assess IUD location.  2. Fatigue.  Patient notes that this has been ongoing for several months. Encouraged dietary modifications (high energy/protein, low carb foods) and exercise to incorporate into daily regimen.  Also advised to discuss further with PCP.  3. H/o cervical dysplasia (H/o LEEP for HGSIL in 2016, had LGSIL in 2017). Patient due for repeat pap in 2 months.  To f/u at that time, or if post-coital bleeding symptoms worsen or fail to improve.  4. Lost IUD threads s/p LEEP, and cervical stenosis.  Patient notes that she may be due for IUD removal soon (thinks it may have been 4-5 years ago.  Discussed process of removal (will likely need manual dilation, or even an endocervical conization due to stenotic os).  Patient notes she would like another replaced.  Will have scheduled at appropriate time.  Will place after next pap smear due to h/o cervical dysplasia in case other cervical procedures are necessary.    Rubie Maid, MD [facility]

## 2017-02-18 LAB — NUSWAB VAGINITIS PLUS (VG+)
Candida albicans, NAA: NEGATIVE
Candida glabrata, NAA: NEGATIVE
Chlamydia trachomatis, NAA: NEGATIVE
Neisseria gonorrhoeae, NAA: NEGATIVE
TRICH VAG BY NAA: NEGATIVE

## 2017-02-20 ENCOUNTER — Telehealth: Payer: Self-pay

## 2017-02-20 DIAGNOSIS — N72 Inflammatory disease of cervix uteri: Secondary | ICD-10-CM

## 2017-02-20 MED ORDER — DOXYCYCLINE HYCLATE 100 MG PO CAPS: 100.0000 mg | ORAL_CAPSULE | Freq: Two times a day (BID) | ORAL | 0 refills | Status: DC

## 2017-02-20 NOTE — Telephone Encounter (Signed)
-----   Message from Rubie Maid, MD sent at 02/20/2017  9:28 AM EDT ----- Please inform patient that her Nuswab was negative.  I would recommend treating with a 7 day course of doxycyline (100 mg BID) to treat any possible inflammation around her cervix.  If her bleeding continues, I would recommend following up with an ultrasound.  Also, she is due for a pap smear in 2 months and will need to be scheduled.

## 2017-02-20 NOTE — Telephone Encounter (Signed)
Called pt informed her of information below. Rx sent in.

## 2017-05-13 ENCOUNTER — Other Ambulatory Visit: Payer: Self-pay | Admitting: Obstetrics and Gynecology

## 2017-05-13 DIAGNOSIS — E559 Vitamin D deficiency, unspecified: Secondary | ICD-10-CM

## 2017-06-12 ENCOUNTER — Encounter: Payer: BC Managed Care – PPO | Admitting: Obstetrics and Gynecology

## 2017-06-20 ENCOUNTER — Encounter: Payer: Self-pay | Admitting: Obstetrics and Gynecology

## 2017-06-20 ENCOUNTER — Ambulatory Visit (INDEPENDENT_AMBULATORY_CARE_PROVIDER_SITE_OTHER): Payer: BC Managed Care – PPO | Admitting: Obstetrics and Gynecology

## 2017-06-20 VITALS — BP 120/82 | HR 77 | Ht 66.0 in | Wt 147.0 lb

## 2017-06-20 DIAGNOSIS — Z87410 Personal history of cervical dysplasia: Secondary | ICD-10-CM | POA: Diagnosis not present

## 2017-06-20 DIAGNOSIS — T8332XD Displacement of intrauterine contraceptive device, subsequent encounter: Secondary | ICD-10-CM

## 2017-06-20 DIAGNOSIS — Z01419 Encounter for gynecological examination (general) (routine) without abnormal findings: Secondary | ICD-10-CM

## 2017-06-20 NOTE — Patient Instructions (Addendum)

## 2017-06-20 NOTE — Progress Notes (Signed)
GYNECOLOGY ANNUAL PHYSICAL EXAM PROGRESS NOTE  Subjective:    Jasmine Hodges is a 46 y.o. G25P2002 female who presents for an annual exam. The patient has no complaints today. The patient is sexually active. The patient wears seatbelts: yes. The patient participates in regular exercise: no. Has the patient ever been transfused or tattooed?: no. The patient reports that there is not domestic violence in her life.    Gynecologic History Menarche age: 27 No LMP recorded. Contraception: Mirena IUD History of STI's: Denies Last Pap: 04/21/2016. Results were: abnormal - LGSIL followed by normal colposcopy and negative biopsies.  Notes h/o abnormal pap smears in the past. Has h/o LEEP. Last mammogram: 01/03/2017. Results were: normal   Obstetric History   G2   P2   T2   P0   A0   L2    SAB0   TAB0   Ectopic0   Multiple0   Live Births2     # Outcome Date GA Lbr Len/2nd Weight Sex Delivery Anes PTL Lv  2 Term 2002    F Vag-Spont   LIV  1 Term 1999    M Vag-Spont   LIV      Past Medical History:  Diagnosis Date  . Anxiety   . CIN II (cervical intraepithelial neoplasia II)   . Depression   . GERD (gastroesophageal reflux disease)   . History of abnormal cells from cervix   . Kidney stone   . LGSIL (low grade squamous intraepithelial dysplasia)   . Reflux gastritis   . Sleep disturbance   . Vaginal Pap smear, abnormal   . Vitamin D deficiency     Past Surgical History:  Procedure Laterality Date  . COLPOSCOPY W/ BIOPSY / CURETTAGE    . CYSTOSCOPY W/ RETROGRADES Bilateral 04/11/2016   Procedure: CYSTOSCOPY WITH RETROGRADE PYELOGRAM;  Surgeon: Hollice Espy, MD;  Location: [facility];  Service: Urology;  Laterality: Bilateral;  . CYSTOSCOPY/URETEROSCOPY/HOLMIUM LASER/STENT PLACEMENT Left 04/11/2016   Procedure: CYSTOSCOPY/URETEROSCOPY/HOLMIUM LASER/STENT PLACEMENT;  Surgeon: Hollice Espy, MD;  Location: [facility];  Service: Urology;  Laterality: Left;  . FOOT SURGERY Left 2010  . LEEP   02/2015  . STONE EXTRACTION WITH BASKET Left 04/11/2016   Procedure: STONE EXTRACTION WITH BASKET;  Surgeon: Hollice Espy, MD;  Location: [facility];  Service: Urology;  Laterality: Left;    Family History  Problem Relation Age of Onset  . Lung cancer Mother   . Hypertension Father   . Kidney Stones Father   . Kidney disease Neg Hx   . Bladder Cancer Neg Hx   . Breast cancer Neg Hx     Social History   Social History  . Marital status: Married    Spouse name: N/A  . Number of children: N/A  . Years of education: N/A   Occupational History  . Not on file.   Social History Main Topics  . Smoking status: Former Smoker    Quit date: 01/30/2013  . Smokeless tobacco: Never Used     Comment: quit 2 1/2 years  . Alcohol use 1.8 oz/week    3 Glasses of wine per week     Comment: occas  . Drug use: No  . Sexual activity: Yes    Birth control/ protection: IUD   Other Topics Concern  . Not on file   Social History Narrative  . No narrative on file    Current Outpatient Prescriptions on File Prior to Visit  Medication Sig Dispense Refill  .  ALPRAZolam (XANAX) 0.5 MG tablet Take 0.5 mg by mouth at bedtime as needed for anxiety.    Marland Kitchen doxycycline (VIBRAMYCIN) 100 MG capsule Take 1 capsule (100 mg total) by mouth 2 (two) times daily. 14 capsule 0  . Melatonin 10 MG TABS Take 1 tablet by mouth at bedtime.    . Vitamin D, Ergocalciferol, (DRISDOL) 50000 units CAPS capsule Take 1 capsule (50,000 Units total) by mouth every 7 (seven) days. 12 capsule 0   No current facility-administered medications on file prior to visit.     No Known Allergies   Review of Systems Constitutional: negative for chills, fatigue, fevers and sweats Eyes: negative for irritation, redness and visual disturbance Ears, nose, mouth, throat, and face: negative for hearing loss, nasal congestion, snoring and tinnitus Respiratory: negative for asthma, cough, sputum Cardiovascular: negative for chest pain,  dyspnea, exertional chest pressure/discomfort, irregular heart beat, palpitations and syncope Gastrointestinal: negative for abdominal pain, change in bowel habits, nausea and vomiting Genitourinary: negative for abnormal menstrual periods, genital lesions, sexual problems and vaginal discharge, dysuria and urinary incontinence Integument/breast: negative for breast lump, breast tenderness and nipple discharge Hematologic/lymphatic: negative for bleeding and easy bruising Musculoskeletal:negative for back pain and muscle weakness Neurological: negative for dizziness, headaches, vertigo and weakness Endocrine: negative for diabetic symptoms including polydipsia, polyuria and skin dryness Allergic/Immunologic: negative for hay fever and urticaria        Objective:  Blood pressure 120/82, pulse 77, height 5\' 6"  (1.676 m), weight 147 lb (66.7 kg), last menstrual period 06/13/2017. Body mass index is 23.73 kg/m.     General Appearance:    Alert, cooperative, no distress, appears stated age  Head:    Normocephalic, without obvious abnormality, atraumatic  Eyes:    PERRL, conjunctiva/corneas clear, EOM's intact, both eyes  Ears:    Normal external ear canals, both ears  Nose:   Nares normal, septum midline, mucosa normal, no drainage or sinus tenderness  Throat:   Lips, mucosa, and tongue normal; teeth and gums normal  Neck:   Supple, symmetrical, trachea midline, no adenopathy; thyroid: no enlargement/tenderness/nodules; no carotid bruit or JVD  Back:     Symmetric, no curvature, ROM normal, no CVA tenderness  Lungs:     Clear to auscultation bilaterally, respirations unlabored  Chest Wall:    No tenderness or deformity   Heart:    Regular rate and rhythm, S1 and S2 normal, no murmur, rub or gallop  Breast Exam:    No tenderness, masses, or nipple abnormality  Abdomen:     Soft, non-tender, bowel sounds active all four quadrants, no masses, no organomegaly.    Genitalia:    Pelvic:external  genitalia normal, vagina without lesions, discharge, or tenderness, rectovaginal septum  normal. Cervix with post-LEEP changes, otherwise normal, no cervical motion tenderness, no adnexal masses or tenderness.  No IUD strings visible. Uterus normal size, shape, mobile, regular contours, nontender.  Rectal:    Normal external sphincter.  No hemorrhoids appreciated. Internal exam not done.   Extremities:   Extremities normal, atraumatic, no cyanosis or edema  Pulses:   2+ and symmetric all extremities  Skin:   Skin color, texture, turgor normal, no rashes or lesions  Lymph nodes:   Cervical, supraclavicular, and axillary nodes normal  Neurologic:   CNII-XII intact, normal strength, sensation and reflexes throughout   .  Labs:  Lab Results  Component Value Date   WBC 8.3 03/30/2016   HGB 13.1 03/30/2016   HCT 38.6 03/30/2016  MCV 88.1 03/30/2016   PLT 209 03/30/2016    Lab Results  Component Value Date   CREATININE 0.82 03/30/2016   BUN 17 03/30/2016   NA 139 03/30/2016   K 4.1 03/30/2016   CL 105 03/30/2016   CO2 29 03/30/2016    Lab Results  Component Value Date   ALT 21 09/19/2013   AST 26 09/19/2013   ALKPHOS 71 09/19/2013   BILITOT 0.6 09/19/2013    Lab Results  Component Value Date   TSH 1.62 11/16/2014    Lab Results  Component Value Date   CHOL 184 11/16/2014   HDL 80 (A) 11/16/2014   LDLCALC 87 11/16/2014   TRIG 86 11/16/2014     Assessment:   Healthy female exam.  H/o cervical dysplasia Lost IUD threads  Plan:     Blood tests: CBC with diff and Comprehensive metabolic panel. Breast self exam technique reviewed and patient encouraged to perform self-exam monthly. Contraception: IUD (has been in place ~ 5 years).  Patient will f/u in 3 months for removal and likely reinsertion of new Mirena.  Has h/o shortened strings due to LEEP procedure 2 years ago.  Discussed healthy lifestyle modifications. Pap smear performed today.  Follow up in 1 year.     Rubie Maid, MD [facility]

## 2017-06-21 LAB — COMPREHENSIVE METABOLIC PANEL
ALK PHOS: 65 IU/L (ref 39–117)
ALT: 17 IU/L (ref 0–32)
AST: 23 IU/L (ref 0–40)
Albumin/Globulin Ratio: 2.7 — ABNORMAL HIGH (ref 1.2–2.2)
Albumin: 4.8 g/dL (ref 3.5–5.5)
BUN/Creatinine Ratio: 19 (ref 9–23)
BUN: 15 mg/dL (ref 6–24)
Bilirubin Total: 0.6 mg/dL (ref 0.0–1.2)
CALCIUM: 10.2 mg/dL (ref 8.7–10.2)
CO2: 25 mmol/L (ref 20–29)
CREATININE: 0.8 mg/dL (ref 0.57–1.00)
Chloride: 103 mmol/L (ref 96–106)
GFR calc Af Amer: 104 mL/min/{1.73_m2} (ref >59)
GFR calc non Af Amer: 90 mL/min/{1.73_m2} (ref >59)
GLOBULIN, TOTAL: 1.8 g/dL (ref 1.5–4.5)
GLUCOSE: 81 mg/dL (ref 65–99)
Potassium: 4.2 mmol/L (ref 3.5–5.2)
SODIUM: 141 mmol/L (ref 134–144)
Total Protein: 6.6 g/dL (ref 6.0–8.5)

## 2017-06-21 LAB — CBC
Hematocrit: 44.9 % (ref 34.0–46.6)
Hemoglobin: 14.8 g/dL (ref 11.1–15.9)
MCH: 30.7 pg (ref 26.6–33.0)
MCHC: 33 g/dL (ref 31.5–35.7)
MCV: 93 fL (ref 79–97)
PLATELETS: 233 10*3/uL (ref 150–379)
RBC: 4.82 x10E6/uL (ref 3.77–5.28)
RDW: 12.9 % (ref 12.3–15.4)
WBC: 11.2 10*3/uL — AB (ref 3.4–10.8)

## 2017-06-22 LAB — PAP IG AND HPV HIGH-RISK
HPV, high-risk: NEGATIVE
PAP Smear Comment: 0

## 2017-09-21 ENCOUNTER — Encounter
Admission: RE | Admit: 2017-09-21 | Discharge: 2017-09-21 | Disposition: A | Discharge status: Home / Self Care | Payer: BC Managed Care – PPO | Source: Ambulatory Visit | Attending: Obstetrics and Gynecology | Admitting: Obstetrics and Gynecology

## 2017-09-21 ENCOUNTER — Encounter: Payer: Self-pay | Admitting: Obstetrics and Gynecology

## 2017-09-21 ENCOUNTER — Ambulatory Visit (INDEPENDENT_AMBULATORY_CARE_PROVIDER_SITE_OTHER): Payer: BC Managed Care – PPO | Admitting: Obstetrics and Gynecology

## 2017-09-21 VITALS — BP 96/62 | HR 72 | Ht 66.0 in | Wt 151.3 lb

## 2017-09-21 DIAGNOSIS — T8332XS Displacement of intrauterine contraceptive device, sequela: Secondary | ICD-10-CM

## 2017-09-21 DIAGNOSIS — N882 Stricture and stenosis of cervix uteri: Secondary | ICD-10-CM | POA: Diagnosis not present

## 2017-09-21 DIAGNOSIS — Z9889 Other specified postprocedural states: Secondary | ICD-10-CM | POA: Diagnosis not present

## 2017-09-21 HISTORY — DX: Personal history of urinary calculi: Z87.442

## 2017-09-21 MED ORDER — ALPRAZOLAM 0.5 MG PO TABS: 0.5000 mg | ORAL_TABLET | Freq: Every evening | ORAL | 3 refills | Status: DC | PRN

## 2017-09-21 NOTE — Progress Notes (Signed)
GYNECOLOGY PROGRESS NOTE  Subjective:    Patient ID: Jasmine Hodges, female    DOB: October 15, 1971, 46 y.o.   MRN: [id number]  HPI  Patient is a 45 y.o. G30P2002 female who presents for IUD removal and reinsertion.  Denies complaints today.   The following portions of the patient's history were reviewed and updated as appropriate: allergies, current medications, past family history, past medical history, past social history, past surgical history and problem list.  Review of Systems Pertinent items noted in HPI and remainder of comprehensive ROS otherwise negative.   Objective:   Blood pressure 96/62, pulse 72, height 5\' 6"  (1.676 m), weight 151 lb 4.8 oz (68.6 kg), last menstrual period 09/07/2017. General appearance: alert and no distress Pelvic: external genitalia normal, rectovaginal septum normal.  Vagina without discharge.  Cervix normal appearing, no lesions and no motion tenderness. Cervical os stenotic.  IUD strings not visible.   Bimanual exam not performed.    Assessment:   Lost IUD threads Cervical stenosis H/o LEEP  Plan:   1. IUD removal attempted today, but unsuccessful (see procedure note below), likely due to stenosis of cervical os and shortened strings after LEEP procedure ~ 2 years ago.  Discussed option of returning after taking Cytotec to help further dilation of the cervix, or having IUD removed in the OR after anesthesia.  Patient desires surgical removal.  Will schedule procedure for tomorrow at patient's request.  2. Pre-op done today. Discussed procedure of Hysteroscopy with IUD removal.   The risks of surgery were discussed in detail with the patient including but not limited to: bleeding which may require transfusion or reoperation; infection which may require prolonged hospitalization or re-hospitalization and antibiotic therapy; injury to bowel, bladder, and major vessels or other surrounding organs; need for additional procedures including laparotomy;  thromboembolic phenomenon, incisional problems and other postoperative or anesthesia complications.  Patient was told that the likelihood that her condition and symptoms will be treated effectively with this surgical management was very high; the postoperative expectations were also discussed in detail. The patient also understands the alternative treatment options which were discussed in full. All questions were answered.  Routine preoperative instructions of having nothing to eat or drink after midnight on the day prior to surgery and also coming to the hospital 1.5 hours prior to her time of surgery were also emphasized.   - Patient notes that once IUD is removed, she and her husband have been discussing vasectomy, and will likely not have new IUD inserted.    GYNECOLOGY OFFICE PROCEDURE NOTE  Jasmine Hodges is a 46 y.o. [phone] here for Mirena IUD removal. No GYN concerns.  Last pap smear was on 06/20/2017 and was normal.  IUD Removal  Patient identified, informed consent performed.  Patient was in the dorsal lithotomy position, normal external genitalia was noted.  A speculum was placed in the patient's vagina, normal discharge was noted, no lesions. The cervix was visualized, no lesions, no abnormal discharge, but was stenotic.  A single-toothed tenaculum was used to grasp the anterior portion of the cervix. The cervix was injected with approximately 8 cc of lidocaine at the 2, 4, 8, and 10 o'clock positions for local analgesia. The cervix was dilated using dilators until a sound could be passed.  A cytobrush, IUD hook, and Kelly forceps were all used to grasp either the IUD or the strings without success. The cervix was still noted to be strictured, and the Kelly forceps were unable to  be fully opened for grasping. Attempts for removal went on for approximately 30 minutes until the patient was no longer able to tolerate the procedure.  The procedure was abandoned at that time.  The decision was made to  schedule a  hysteroscopic removal of the IUD in the operating room.    Jasmine Maid, MD [facility]

## 2017-09-21 NOTE — Patient Instructions (Signed)
Your procedure is scheduled on: Tomorrow 09/22/17 Report to Day Surgery. At 6:00  Remember: Instructions that are not followed completely may result in serious medical risk, up to and including death, or upon the discretion of your surgeon and anesthesiologist your surgery may need to be rescheduled.     _X__ 1. Do not eat food after midnight the night before your procedure.                 No gum chewing or hard candies. You may drink clear liquids up to 2 hours                 before you are scheduled to arrive for your surgery- DO not drink clear                 liquids within 2 hours of the start of your surgery.                 Clear Liquids include:  water, apple juice without pulp, clear carbohydrate                 drink such as Clearfast of Gartorade, Black Coffee or Tea (Do not add                 anything to coffee or tea).     _X__ 2.  No Alcohol for 24 hours before or after surgery.   _X__ 3.  Do Not Smoke or use e-cigarettes For 24 Hours Prior to Your Surgery.                 Do not use any chewable tobacco products for at least 6 hours prior to                 surgery.  ____  4.  Bring all medications with you on the day of surgery if instructed.   ____  5.  Notify your doctor if there is any change in your medical condition      (cold, fever, infections).     Do not wear jewelry, make-up, hairpins, clips or nail polish. Do not wear lotions, powders, or perfumes. You may wear deodorant. Do not shave 48 hours prior to surgery. Men may shave face and neck. Do not bring valuables to the hospital.    [facility] is not responsible for any belongings or valuables.  Contacts, dentures or bridgework may not be worn into surgery. Leave your suitcase in the car. After surgery it may be brought to your room. For patients admitted to the hospital, discharge time is determined by your treatment team.   Patients discharged the day of surgery will not be  allowed to drive home.   Please read over the following fact sheets that you were given:     ____ Take these medicines the morning of surgery with A SIP OF WATER:    1. none  2.   3.   4.  5.  6.  ____ Fleet Enema (as directed)   ____ Use CHG Soap as directed  ____ Use inhalers on the day of surgery  ____ Stop metformin 2 days prior to surgery    ____ Take 1/2 of usual insulin dose the night before surgery. No insulin the morning          of surgery.   ____ Stop Coumadin/Plavix/aspirin on   ____ Stop Anti-inflammatories on    ____ Stop supplements until after surgery.    ____  Bring C-Pap to the hospital.

## 2017-09-21 NOTE — H&P (Signed)
GYNECOLOGY PREOPERATIVE HISTORY AND PHYSICAL   Subjective:  Jasmine Hodges is a 46 y.o. [phone] here for surgical management of lost IUD threads and retained IUD.  No significant preoperative concerns.  Proposed surgery: Hysteroscopic IUD removal   Pertinent Gynecological History: Menses: patient does not have periods with IUD.  Contraception: IUD Last mammogram: normal Date: 01/03/2017 Last pap: normal Date: 06/2017 Patient denies any other pertinent gynecologic issues.   Past Medical History:  Diagnosis Date  . Anxiety   . CIN II (cervical intraepithelial neoplasia II)   . Depression   . GERD (gastroesophageal reflux disease)   . History of abnormal cells from cervix   . History of kidney stones   . Kidney stone   . LGSIL (low grade squamous intraepithelial dysplasia)   . Reflux gastritis   . Sleep disturbance   . Vaginal Pap smear, abnormal   . Vitamin D deficiency     Past Surgical History:  Procedure Laterality Date  . COLPOSCOPY W/ BIOPSY / CURETTAGE    . CYSTOSCOPY W/ RETROGRADES Bilateral 04/11/2016   Procedure: CYSTOSCOPY WITH RETROGRADE PYELOGRAM;  Surgeon: Hollice Espy, MD;  Location: [facility] ORS;  Service: Urology;  Laterality: Bilateral;  . CYSTOSCOPY/URETEROSCOPY/HOLMIUM LASER/STENT PLACEMENT Left 04/11/2016   Procedure: CYSTOSCOPY/URETEROSCOPY/HOLMIUM LASER/STENT PLACEMENT;  Surgeon: Hollice Espy, MD;  Location: [facility] ORS;  Service: Urology;  Laterality: Left;  . FOOT SURGERY Left 2010  . LEEP  02/2015  . STONE EXTRACTION WITH BASKET Left 04/11/2016   Procedure: STONE EXTRACTION WITH BASKET;  Surgeon: Hollice Espy, MD;  Location: [facility] ORS;  Service: Urology;  Laterality: Left;    OB History  Gravida Para Term Preterm AB Living  2 2 2     2   SAB TAB Ectopic Multiple Live Births          2    # Outcome Date GA Lbr Len/2nd Weight Sex Delivery Anes PTL Lv  2 Term 2002    F Vag-Spont   LIV  1 Term 1999    M Vag-Spont   LIV       Family History  Problem  Relation Age of Onset  . Lung cancer Mother   . Hypertension Father   . Kidney Stones Father   . Diabetes Father   . Kidney disease Neg Hx   . Bladder Cancer Neg Hx   . Breast cancer Neg Hx     Social History   Social History  . Marital status: Married    Spouse name: N/A  . Number of children: N/A  . Years of education: N/A   Occupational History  . Not on file.   Social History Main Topics  . Smoking status: Current Every Day Smoker    Packs/day: 0.50    Types: Cigarettes    Last attempt to quit: 01/30/2013  . Smokeless tobacco: Never Used     Comment: quit 2 1/2 years  . Alcohol use 1.8 oz/week    3 Glasses of wine per week     Comment: occas  . Drug use: No  . Sexual activity: Yes    Birth control/ protection: IUD   Other Topics Concern  . Not on file   Social History Narrative  . No narrative on file    Current Outpatient Prescriptions on File Prior to Visit  Medication Sig Dispense Refill  . Melatonin 10 MG TABS Take 1 tablet by mouth at bedtime.     No current facility-administered medications on file prior to visit.  No Known Allergies   Review of Systems Constitutional: No recent fever/chills/sweats Respiratory: No recent cough/bronchitis Cardiovascular: No chest pain Gastrointestinal: No recent nausea/vomiting/diarrhea Genitourinary: No UTI symptoms Hematologic/lymphatic:No history of coagulopathy or recent blood thinner use    Objective:   Blood pressure 96/62, pulse 72, height 5\' 6"  (1.676 m), weight 151 lb 4.8 oz (68.6 kg), last menstrual period 09/07/2017. CONSTITUTIONAL: Well-developed, well-nourished female in no acute distress.  HENT:  Normocephalic, atraumatic, External right and left ear normal. Oropharynx is clear and moist EYES: Conjunctivae and EOM are normal. Pupils are equal, round, and reactive to light. No scleral icterus.  NECK: Normal range of motion, supple, no masses SKIN: Skin is warm and dry. No rash noted. Not  diaphoretic. No erythema. No pallor. NEUROLOGIC: Alert and oriented to person, place, and time. Normal reflexes, muscle tone coordination. No cranial nerve deficit noted. PSYCHIATRIC: Normal mood and affect. Normal behavior. Normal judgment and thought content. CARDIOVASCULAR: Normal heart rate noted, regular rhythm RESPIRATORY: Effort and breath sounds normal, no problems with respiration noted ABDOMEN: Soft, nontender, nondistended. PELVIC: Deferred MUSCULOSKELETAL: Normal range of motion. No edema and no tenderness. 2+ distal pulses.    Labs: No results found for this or any previous visit (from the past 336 hour(s)).    Assessment:     Lost IUD threads      Plan:    Counseling: Procedure, risks, reasons, benefits and complications (including injury to bowel, bladder, major blood vessel, ureter, bleeding, possibility of transfusion, infection, or fistula formation) reviewed in detail. Likelihood of success in alleviating the patient's condition was discussed. Routine postoperative instructions will be reviewed with the patient and her family in detail after surgery.  The patient concurred with the proposed plan.  Preop testing ordered. Instructions reviewed, including NPO after midnight.    Rubie Maid, MD [facility]

## 2017-09-22 ENCOUNTER — Encounter
Admission: RE | Disposition: A | Discharge status: Home / Self Care | Payer: Self-pay | Source: Ambulatory Visit | Attending: Obstetrics and Gynecology

## 2017-09-22 ENCOUNTER — Encounter: Payer: Self-pay | Admitting: *Deleted

## 2017-09-22 ENCOUNTER — Ambulatory Visit: Payer: BC Managed Care – PPO | Admitting: Anesthesiology

## 2017-09-22 ENCOUNTER — Ambulatory Visit
Admission: RE | Admit: 2017-09-22 | Discharge: 2017-09-22 | Disposition: A | Discharge status: Home / Self Care | Payer: BC Managed Care – PPO | Source: Ambulatory Visit | Attending: Obstetrics and Gynecology | Admitting: Obstetrics and Gynecology

## 2017-09-22 DIAGNOSIS — Y9289 Other specified places as the place of occurrence of the external cause: Secondary | ICD-10-CM | POA: Insufficient documentation

## 2017-09-22 DIAGNOSIS — N882 Stricture and stenosis of cervix uteri: Secondary | ICD-10-CM | POA: Insufficient documentation

## 2017-09-22 DIAGNOSIS — Y768 Miscellaneous obstetric and gynecological devices associated with adverse incidents, not elsewhere classified: Secondary | ICD-10-CM | POA: Insufficient documentation

## 2017-09-22 DIAGNOSIS — T8332XA Displacement of intrauterine contraceptive device, initial encounter: Secondary | ICD-10-CM | POA: Insufficient documentation

## 2017-09-22 DIAGNOSIS — F17210 Nicotine dependence, cigarettes, uncomplicated: Secondary | ICD-10-CM | POA: Diagnosis not present

## 2017-09-22 HISTORY — PX: IUD REMOVAL: SHX5392

## 2017-09-22 HISTORY — PX: HYSTEROSCOPY: SHX211

## 2017-09-22 LAB — CBC
HCT: 39.1 % (ref 35.0–47.0)
Hemoglobin: 13.6 g/dL (ref 12.0–16.0)
MCH: 31.2 pg (ref 26.0–34.0)
MCHC: 34.7 g/dL (ref 32.0–36.0)
MCV: 89.9 fL (ref 80.0–100.0)
PLATELETS: 187 10*3/uL (ref 150–440)
RBC: 4.35 MIL/uL (ref 3.80–5.20)
RDW: 13 % (ref 11.5–14.5)
WBC: 6.6 10*3/uL (ref 3.6–11.0)

## 2017-09-22 LAB — POCT PREGNANCY, URINE: Preg Test, Ur: NEGATIVE

## 2017-09-22 SURGERY — HYSTEROSCOPY
Anesthesia: General | Wound class: Clean Contaminated

## 2017-09-22 MED ORDER — HYDROCODONE-ACETAMINOPHEN 5-325 MG PO TABS
ORAL_TABLET | ORAL | Status: AC
  Filled 2017-09-22: qty 2

## 2017-09-22 MED ORDER — HYDROCODONE-ACETAMINOPHEN 5-325 MG PO TABS: 1.0000 | ORAL_TABLET | Freq: Four times a day (QID) | ORAL | 0 refills | Status: DC | PRN

## 2017-09-22 MED ORDER — LACTATED RINGERS IV SOLN
INTRAVENOUS | Status: DC
  Administered 2017-09-22: 07:00:00 via INTRAVENOUS

## 2017-09-22 MED ORDER — FENTANYL CITRATE (PF) 100 MCG/2ML IJ SOLN: 25.0000 ug | INTRAMUSCULAR | Status: DC | PRN

## 2017-09-22 MED ORDER — FENTANYL CITRATE (PF) 100 MCG/2ML IJ SOLN
INTRAMUSCULAR | Status: AC
  Filled 2017-09-22: qty 2

## 2017-09-22 MED ORDER — FAMOTIDINE 20 MG PO TABS
20.0000 mg | ORAL_TABLET | Freq: Once | ORAL | Status: AC
  Administered 2017-09-22: 20 mg via ORAL

## 2017-09-22 MED ORDER — MIDAZOLAM HCL 2 MG/2ML IJ SOLN
INTRAMUSCULAR | Status: AC
  Filled 2017-09-22: qty 2

## 2017-09-22 MED ORDER — SUCCINYLCHOLINE CHLORIDE 20 MG/ML IJ SOLN
INTRAMUSCULAR | Status: AC
  Filled 2017-09-22: qty 1

## 2017-09-22 MED ORDER — FAMOTIDINE 20 MG PO TABS
ORAL_TABLET | ORAL | Status: AC
  Administered 2017-09-22: 20 mg via ORAL
  Filled 2017-09-22: qty 1

## 2017-09-22 MED ORDER — MIDAZOLAM HCL 2 MG/2ML IJ SOLN
INTRAMUSCULAR | Status: DC | PRN
  Administered 2017-09-22: 2 mg via INTRAVENOUS

## 2017-09-22 MED ORDER — ONDANSETRON HCL 4 MG/2ML IJ SOLN
INTRAMUSCULAR | Status: DC | PRN
  Administered 2017-09-22: 4 mg via INTRAVENOUS

## 2017-09-22 MED ORDER — FENTANYL CITRATE (PF) 100 MCG/2ML IJ SOLN
INTRAMUSCULAR | Status: DC | PRN
  Administered 2017-09-22 (×4): 25 ug via INTRAVENOUS

## 2017-09-22 MED ORDER — DEXAMETHASONE SODIUM PHOSPHATE 10 MG/ML IJ SOLN
INTRAMUSCULAR | Status: DC | PRN
  Administered 2017-09-22: 10 mg via INTRAVENOUS

## 2017-09-22 MED ORDER — ONDANSETRON HCL 4 MG/2ML IJ SOLN: 4.0000 mg | Freq: Once | INTRAMUSCULAR | Status: DC | PRN

## 2017-09-22 MED ORDER — LIDOCAINE HCL (CARDIAC) 20 MG/ML IV SOLN
INTRAVENOUS | Status: DC | PRN
  Administered 2017-09-22: 60 mg via INTRAVENOUS

## 2017-09-22 MED ORDER — PROPOFOL 10 MG/ML IV BOLUS
INTRAVENOUS | Status: DC | PRN
  Administered 2017-09-22: 150 mg via INTRAVENOUS

## 2017-09-22 MED ORDER — PROPOFOL 10 MG/ML IV BOLUS
INTRAVENOUS | Status: AC
  Filled 2017-09-22: qty 20

## 2017-09-22 MED ORDER — GLYCOPYRROLATE 0.2 MG/ML IJ SOLN
INTRAMUSCULAR | Status: DC | PRN
  Administered 2017-09-22: 0.2 mg via INTRAVENOUS

## 2017-09-22 MED ORDER — LIDOCAINE HCL (PF) 2 % IJ SOLN
INTRAMUSCULAR | Status: AC
  Filled 2017-09-22: qty 10

## 2017-09-22 MED ORDER — HYDROCODONE-ACETAMINOPHEN 5-325 MG PO TABS
1.0000 | ORAL_TABLET | Freq: Four times a day (QID) | ORAL | Status: DC | PRN
  Administered 2017-09-22 (×2): 1 via ORAL

## 2017-09-22 MED ORDER — IBUPROFEN 800 MG PO TABS: 800.0000 mg | ORAL_TABLET | Freq: Three times a day (TID) | ORAL | 1 refills | Status: DC | PRN

## 2017-09-22 MED ORDER — LACTATED RINGERS IV SOLN: INTRAVENOUS | Status: DC

## 2017-09-22 MED ORDER — KETOROLAC TROMETHAMINE 30 MG/ML IJ SOLN
INTRAMUSCULAR | Status: DC | PRN
  Administered 2017-09-22: 30 mg via INTRAVENOUS

## 2017-09-22 SURGICAL SUPPLY — 19 items
BAG INFUSER PRESSURE 100CC (MISCELLANEOUS) ×2 IMPLANT
CATH ROBINSON RED A/P 16FR (CATHETERS) ×2 IMPLANT
CUP MEDICINE 2OZ PLAST GRAD ST (MISCELLANEOUS) ×2 IMPLANT
DRAPE UNDER BUTTOCK W/FLU (DRAPES) ×2 IMPLANT
DRSG TELFA 3X8 NADH (GAUZE/BANDAGES/DRESSINGS) ×2 IMPLANT
GLOVE BIO SURGEON STRL SZ 6.5 (GLOVE) ×2 IMPLANT
GLOVE INDICATOR 7.0 STRL GRN (GLOVE) ×2 IMPLANT
GOWN STRL REUS W/ TWL LRG LVL3 (GOWN DISPOSABLE) ×2 IMPLANT
GOWN STRL REUS W/TWL LRG LVL3 (GOWN DISPOSABLE) ×2
IV LACTATED RINGERS 1000ML (IV SOLUTION) ×2 IMPLANT
KIT RM TURNOVER CYSTO AR (KITS) ×2 IMPLANT
PACK DNC HYST (MISCELLANEOUS) ×2 IMPLANT
PAD OB MATERNITY 4.3X12.25 (PERSONAL CARE ITEMS) ×2 IMPLANT
PAD PREP 24X41 OB/GYN DISP (PERSONAL CARE ITEMS) ×2 IMPLANT
SOL PREP PVP 2OZ (MISCELLANEOUS) ×2
SOLUTION PREP PVP 2OZ (MISCELLANEOUS) ×1 IMPLANT
SPONGE XRAY 4X4 16PLY STRL (MISCELLANEOUS) ×2 IMPLANT
SURGILUBE 2OZ TUBE FLIPTOP (MISCELLANEOUS) ×2 IMPLANT
TOWEL OR 17X26 4PK STRL BLUE (TOWEL DISPOSABLE) ×2 IMPLANT

## 2017-09-22 NOTE — H&P (Signed)
UPDATE TO PREVIOUS HISTORY AND PHYSICAL  The patient has been seen and examined.  H&P is up to date, no changes noted.  Patient can proceed to the OR for scheduled procedure.   Rubie Maid, MD 09/22/2017 7:26 AM

## 2017-09-22 NOTE — Op Note (Signed)
Procedure(s): HYSTEROSCOPY INTRAUTERINE DEVICE (IUD) REMOVAL Procedure Note  Jasmine Hodges female 46 y.o. 09/22/2017  Indications: The patient is a 46 y.o. G64P2002 female with lost IUD threads, cervical stenosis and post-LEEP cervical changes  Pre-operative Diagnosis:  lost IUD threads, cervical stenosis and post-LEEP cervical changes  Post-operative Diagnosis: Same  Surgeon: Rubie Maid, MD  Assistants: None  Anesthesia: General endotracheal anesthesia  Findings: IUD located in place, with IUD threads coiled just above the endocervical os. Shortened IUD threads noted.  Mildly proliferative endometrium.  Tubal ostia were visualized bilaterally.  No intrauterine masses.   Procedure Details: The patient was seen in the Holding Room. The risks, benefits, complications, treatment options, and expected outcomes were discussed with the patient.  The patient concurred with the proposed plan, giving informed consent.  The site of surgery properly noted/marked. The patient was taken to the Operating Room, identified as Jasmine Hodges and the procedure verified as Procedure(s) (LRB): HYSTEROSCOPY (N/A) INTRAUTERINE DEVICE (IUD) REMOVAL (N/A). A Time Out was held and the above information confirmed.  The patient was then placed under general anesthesia without difficulty.  She was then prepped and draped in the normal sterile fashion, and placed in the dorsal lithotomy position.  A time out was performed.  An exam under anesthesia was performed with the findings noted above.  Straight catheterization was performed. A sterile speculum was inserted into vagina. A single-tooth tenaculum was used to grasp the anterior lip of the cervix. Cervical dilation was performed. A 5 mm hysteroscope was introduced into the uterus under direct visualization. The cavity was allowed to fill, and then the entire cavity was explored with the findings described above. A hysteroscopic grasper was inserted and used to grasp  the strings of the IUD.  The hysteroscope along with the IUD were removed.   The hysteroscope was then re-introduced for final survey, with mildly proliferative endometrium.  The hysteroscope was removed from the patient's uterine cavity. The tenaculum was removed and excellent hemostasis was noted. The speculum was removed from the vagina.   All instrument and sponge counts were correct at the end of the procedure x 2.  The patient tolerated the procedure well.  She was awakened and taken to the PACU in stable condition.   Estimated Blood Loss:  minimal      Drains: straight catheterization prior to procedure with  250 ml of clear urine         Total IV Fluids:  500 ml of Lactated Ringers  Specimens: Mirena IUD         Implants: None         Complications:  None; patient tolerated the procedure well.         Disposition: PACU - hemodynamically stable.         Condition: stable   Rubie Maid, MD [facility]

## 2017-09-22 NOTE — Anesthesia Post-op Follow-up Note (Signed)
Anesthesia QCDR form completed.        

## 2017-09-22 NOTE — Transfer of Care (Signed)
Immediate Anesthesia Transfer of Care Note  Patient: Jasmine Hodges  Procedure(s) Performed: HYSTEROSCOPY (N/A ) INTRAUTERINE DEVICE (IUD) REMOVAL (N/A )  Patient Location: PACU  Anesthesia Type:General  Level of Consciousness: awake, alert  and oriented  Airway & Oxygen Therapy: Patient Spontanous Breathing and Patient connected to face mask oxygen  Post-op Assessment: Report given to RN and Post -op Vital signs reviewed and stable  Post vital signs: Reviewed and stable  Last Vitals:  Vitals:   09/22/17 0615  BP: 102/61  Pulse: 64  Resp: 12  Temp: 36.5 C  SpO2: 100%    Last Pain:  Vitals:   09/22/17 0615  TempSrc: Oral  PainSc: 3          Complications: No apparent anesthesia complications

## 2017-09-22 NOTE — Anesthesia Procedure Notes (Signed)
Procedure Name: LMA Insertion Performed by: Momoka Stringfield Pre-anesthesia Checklist: Patient identified, Patient being monitored, Timeout performed, Emergency Drugs available and Suction available Patient Re-evaluated:Patient Re-evaluated prior to induction Oxygen Delivery Method: Circle system utilized Preoxygenation: Pre-oxygenation with 100% oxygen Induction Type: IV induction Ventilation: Mask ventilation without difficulty LMA: LMA inserted LMA Size: 4.0 Tube type: Oral Number of attempts: 1 Placement Confirmation: positive ETCO2 and breath sounds checked- equal and bilateral Tube secured with: Tape Dental Injury: Teeth and Oropharynx as per pre-operative assessment        

## 2017-09-22 NOTE — Anesthesia Preprocedure Evaluation (Signed)
Anesthesia Evaluation  Patient identified by MRN, date of birth, ID band Patient awake    Reviewed: Allergy & Precautions, NPO status , Patient's Chart, lab work & pertinent test results, reviewed documented beta blocker date and time   Airway Mallampati: II  TM Distance: >3 FB     Dental  (+) Chipped   Pulmonary Current Smoker,           Cardiovascular      Neuro/Psych PSYCHIATRIC DISORDERS Anxiety Depression    GI/Hepatic GERD  Controlled,  Endo/Other    Renal/GU Renal disease     Musculoskeletal   Abdominal   Peds  Hematology   Anesthesia Other Findings   Reproductive/Obstetrics                             Anesthesia Physical Anesthesia Plan  ASA: II  Anesthesia Plan: General   Post-op Pain Management:    Induction: Intravenous  PONV Risk Score and Plan:   Airway Management Planned: LMA  Additional Equipment:   Intra-op Plan:   Post-operative Plan:   Informed Consent: I have reviewed the patients History and Physical, chart, labs and discussed the procedure including the risks, benefits and alternatives for the proposed anesthesia with the patient or authorized representative who has indicated his/her understanding and acceptance.     Plan Discussed with: CRNA  Anesthesia Plan Comments:         Anesthesia Quick Evaluation

## 2017-09-22 NOTE — Anesthesia Postprocedure Evaluation (Signed)
Anesthesia Post Note  Patient: Jasmine Hodges  Procedure(s) Performed: HYSTEROSCOPY (N/A ) INTRAUTERINE DEVICE (IUD) REMOVAL (N/A )  Patient location during evaluation: PACU Anesthesia Type: General Level of consciousness: awake and alert Pain management: pain level controlled Vital Signs Assessment: post-procedure vital signs reviewed and stable Respiratory status: spontaneous breathing, nonlabored ventilation, respiratory function stable and patient connected to nasal cannula oxygen Cardiovascular status: blood pressure returned to baseline and stable Postop Assessment: no apparent nausea or vomiting Anesthetic complications: no     Last Vitals:  Vitals:   09/22/17 0823 09/22/17 0838  BP: 108/77 112/79  Pulse: 77 88  Resp: 18 19  Temp: 36.6 C   SpO2: 100% 100%    Last Pain:  Vitals:   09/22/17 0838  TempSrc:   PainSc: 0-No pain                 Calan Doren S

## 2017-09-22 NOTE — Discharge Instructions (Signed)
Hysteroscopy, Care After °Refer to this sheet in the next few weeks. These instructions provide you with information on caring for yourself after your procedure. Your health care provider may also give you more specific instructions. Your treatment has been planned according to current medical practices, but problems sometimes occur. Call your health care provider if you have any problems or questions after your procedure. °What can I expect after the procedure? °After your procedure, it is typical to have the following: °· You may have some cramping. This normally lasts for a couple days. °· You may have bleeding. This can vary from light spotting for a few days to menstrual-like bleeding for 3-7 days. ° °Follow these instructions at home: °· Rest for the first 1-2 days after the procedure. °· Only take over-the-counter or prescription medicines as directed by your health care provider. Do not take aspirin. It can increase the chances of bleeding. °· Take showers instead of baths for 2 weeks or as directed by your health care provider. °· Do not drive for 24 hours or as directed. °· Do not drink alcohol while taking pain medicine. °· Do not use tampons, douche, or have sexual intercourse for 2 weeks or until your health care provider says it is okay. °· Take your temperature twice a day for 4-5 days. Write it down each time. °· Follow your health care provider's advice about diet, exercise, and lifting. °· If you develop constipation, you may: °? Take a mild laxative if your health care provider approves. °? Add bran foods to your diet. °? Drink enough fluids to keep your urine clear or pale yellow. °· Try to have someone with you or available to you for the first 24-48 hours, especially if you were given a general anesthetic. °· Follow up with your health care provider as directed. °Contact a health care provider if: °· You feel dizzy or lightheaded. °· You feel sick to your stomach (nauseous). °· You have  abnormal vaginal discharge. °· You have a rash. °· You have pain that is not controlled with medicine. °Get help right away if: °· You have bleeding that is heavier than a normal menstrual period. °· You have a fever. °· You have increasing cramps or pain, not controlled with medicine. °· You have new belly (abdominal) pain. °· You pass out. °· You have pain in the tops of your shoulders (shoulder strap areas). °· You have shortness of breath. °This information is not intended to replace advice given to you by your health care provider. Make sure you discuss any questions you have with your health care provider. °Document Released: 09/11/2013 Document Revised: 04/28/2016 Document Reviewed: 06/20/2013 °Elsevier Interactive Patient Education © 2017 [employer] ° ° ° °AMBULATORY SURGERY  °DISCHARGE INSTRUCTIONS ° ° °1) The drugs that you were given will stay in your system until tomorrow so for the next 24 hours you should not: ° °A) Drive an automobile °B) Make any legal decisions °C) Drink any alcoholic beverage ° ° °2) You may resume regular meals tomorrow.  Today it is better to start with liquids and gradually work up to solid foods. ° °You may eat anything you prefer, but it is better to start with liquids, then soup and crackers, and gradually work up to solid foods. ° ° °3) Please notify your doctor immediately if you have any unusual bleeding, trouble breathing, redness and pain at the surgery site, drainage, fever, or pain not relieved by medication. ° ° ° °4) Additional   Instructions: ° ° ° ° ° ° ° °Please contact your physician with any problems or Same Day Surgery at [phone], Monday through Friday 6 am to 4 pm, or [place] [facility] [place] Main number at [phone]. °

## 2017-09-25 ENCOUNTER — Encounter: Payer: Self-pay | Admitting: Obstetrics and Gynecology

## 2017-10-02 ENCOUNTER — Encounter: Payer: Self-pay | Admitting: Obstetrics and Gynecology

## 2017-10-02 ENCOUNTER — Encounter: Payer: BC Managed Care – PPO | Admitting: Obstetrics and Gynecology

## 2017-10-12 ENCOUNTER — Ambulatory Visit (INDEPENDENT_AMBULATORY_CARE_PROVIDER_SITE_OTHER): Payer: BC Managed Care – PPO | Admitting: Obstetrics and Gynecology

## 2017-10-12 ENCOUNTER — Encounter: Payer: Self-pay | Admitting: Obstetrics and Gynecology

## 2017-10-12 VITALS — BP 104/65 | HR 62 | Ht 66.0 in | Wt 148.5 lb

## 2017-10-12 DIAGNOSIS — Z9889 Other specified postprocedural states: Secondary | ICD-10-CM

## 2017-10-12 NOTE — Progress Notes (Signed)
    OBSTETRICS/GYNECOLOGY POST-OPERATIVE CLINIC VISIT  Subjective:     Jasmine Hodges is a 46 y.o. female who presents to the clinic 3 weeks status post hysteroscopic IUD removal for lost threads at expiration date of IUD.  Eating a regular diet without difficulty. Bowel movements are normal. The patient is not having any pain.  Does note that most recent menses was a lot heavier than normal.   The following portions of the patient's history were reviewed and updated as appropriate: allergies, current medications, past family history, past medical history, past social history, past surgical history and problem list.  Review of Systems Pertinent items noted in HPI and remainder of comprehensive ROS otherwise negative.    Objective:    BP 104/65 (BP Location: Left Arm, Patient Position: Sitting, Cuff Size: Normal)   Pulse 62   Ht 5\' 6"  (1.676 m)   Wt 148 lb 8 oz (67.4 kg)   LMP 10/02/2017   BMI 23.97 kg/m  General:  alert and no distress  Abdomen: soft, bowel sounds active, non-tender  Pelvis: deferred    Pathology:  None  Assessment:    Doing well postoperatively.   Plan:   1.Can resume all normal activity 2. Has resumed work.   3. Advised that now that IUD is out, her menses should return to her normal, however if she continues to have heavy bleeding, to return for further management.  4.  Follow up: has appointment in July 2019 for routine scheduled annual exam    Rubie Maid, MD [facility]

## 2018-03-20 ENCOUNTER — Other Ambulatory Visit: Payer: Self-pay | Admitting: Obstetrics and Gynecology

## 2018-03-20 DIAGNOSIS — Z1231 Encounter for screening mammogram for malignant neoplasm of breast: Secondary | ICD-10-CM

## 2018-04-10 ENCOUNTER — Ambulatory Visit
Admission: RE | Admit: 2018-04-10 | Discharge: 2018-04-10 | Disposition: A | Discharge status: Home / Self Care | Payer: BC Managed Care – PPO | Source: Ambulatory Visit | Attending: Obstetrics and Gynecology | Admitting: Obstetrics and Gynecology

## 2018-04-10 DIAGNOSIS — R928 Other abnormal and inconclusive findings on diagnostic imaging of breast: Secondary | ICD-10-CM | POA: Insufficient documentation

## 2018-04-10 DIAGNOSIS — Z1231 Encounter for screening mammogram for malignant neoplasm of breast: Secondary | ICD-10-CM | POA: Diagnosis present

## 2018-04-12 ENCOUNTER — Other Ambulatory Visit: Payer: Self-pay | Admitting: Obstetrics and Gynecology

## 2018-04-12 DIAGNOSIS — R928 Other abnormal and inconclusive findings on diagnostic imaging of breast: Secondary | ICD-10-CM

## 2018-04-12 DIAGNOSIS — N6489 Other specified disorders of breast: Secondary | ICD-10-CM

## 2018-04-25 ENCOUNTER — Ambulatory Visit
Admission: RE | Admit: 2018-04-25 | Discharge: 2018-04-25 | Disposition: A | Discharge status: Home / Self Care | Payer: BC Managed Care – PPO | Source: Ambulatory Visit | Attending: Obstetrics and Gynecology | Admitting: Obstetrics and Gynecology

## 2018-04-25 ENCOUNTER — Other Ambulatory Visit: Payer: Self-pay | Admitting: Obstetrics and Gynecology

## 2018-04-25 DIAGNOSIS — R928 Other abnormal and inconclusive findings on diagnostic imaging of breast: Secondary | ICD-10-CM | POA: Diagnosis present

## 2018-04-25 DIAGNOSIS — N6489 Other specified disorders of breast: Secondary | ICD-10-CM | POA: Diagnosis present

## 2018-05-08 ENCOUNTER — Ambulatory Visit
Admission: RE | Admit: 2018-05-08 | Discharge: 2018-05-08 | Disposition: A | Discharge status: Home / Self Care | Payer: BC Managed Care – PPO | Source: Ambulatory Visit | Attending: Obstetrics and Gynecology | Admitting: Obstetrics and Gynecology

## 2018-05-08 DIAGNOSIS — R928 Other abnormal and inconclusive findings on diagnostic imaging of breast: Secondary | ICD-10-CM

## 2018-05-08 DIAGNOSIS — N6489 Other specified disorders of breast: Secondary | ICD-10-CM | POA: Insufficient documentation

## 2018-05-08 HISTORY — PX: BREAST BIOPSY: SHX20

## 2018-05-09 LAB — SURGICAL PATHOLOGY

## 2018-05-15 ENCOUNTER — Telehealth: Payer: Self-pay | Admitting: Obstetrics and Gynecology

## 2018-05-15 NOTE — Telephone Encounter (Signed)
We did a left breast biopsy on the patient that revealed fibrosis and Dr. Owens Shark feels this is discordant and recommends that it be excised.  Jasmine Hodges made the patient an appointment with Dr. Peyton Najjar on Tues, June 18 @ 2:45.  No call back needed.  Thank you

## 2018-05-18 NOTE — Telephone Encounter (Signed)
Sent message to [facility].

## 2018-05-23 ENCOUNTER — Other Ambulatory Visit: Payer: Self-pay | Admitting: General Surgery

## 2018-05-23 DIAGNOSIS — R928 Other abnormal and inconclusive findings on diagnostic imaging of breast: Secondary | ICD-10-CM

## 2018-05-24 ENCOUNTER — Ambulatory Visit: Payer: Self-pay | Admitting: General Surgery

## 2018-05-24 NOTE — H&P (View-Only) (Signed)
PATIENT PROFILE: Jasmine Hodges is a 47 y.o. female who presents to the Clinic for consultation at the request of Dr. Marcelline Mates for evaluation of discordant biopsy.  PCP:  Evern Bio, NP  HISTORY OF PRESENT ILLNESS: Ms. Popson reports she had her screening mammogram as usual every year. She denies any palpable mass, skin changes, nipple retraction or secretions. She had a screening mammogram on 04/10/18 and was found with an asymmetry that warranted further imaging. Diagnostic mammogram and ultrasound found an irregular mass of 2.1 cm x 0.7 x 1.8 cm located on the left breast at 11:30, 9 cm from the nipple. Patient had ultrasound guided biopsy on 05/16/18 which showed mild periductal fibrosis without atypia or malignancy. This was found to be dicordant by Dr. Owens Shark and excisional biopsy was recommended.   Family history of breast cancer: none Family history of other cancers: mother (lung cancer) Menarche: 59 years of age Menopause: 05/15/18 Used OCP: none Patient had two pregnancies Used estrogen and progesterone therapy: none         History of Radiation to the chest: none  PROBLEM LIST:     Problem List  Date Reviewed: 11/14/2017   None      GENERAL REVIEW OF SYSTEMS:   General ROS: negative for - chills, fatigue, fever, weight gain or weight loss Allergy and Immunology ROS: negative for - hives  Hematological and Lymphatic ROS: negative for - bleeding problems or bruising, negative for palpable nodes Endocrine ROS: negative for - heat or cold intolerance, hair changes Respiratory ROS: negative for - cough, shortness of breath or wheezing Cardiovascular ROS: no chest pain or palpitations. Positive for racing heart or irregular heart beat.  GI ROS: negative for nausea, vomiting, abdominal pain, diarrhea, constipation Musculoskeletal ROS: negative for - joint swelling or muscle pain Neurological ROS: negative for - confusion, syncope Dermatological ROS: negative for pruritus  and rash Psychiatric: negative for anxiety, depression, difficulty sleeping and memory loss  MEDICATIONS: CurrentMedications        Current Outpatient Medications  Medication Sig Dispense Refill  . ALPRAZolam (XANAX) 0.5 MG tablet TAKE 1 TABLET BY MOUTH AT BEDTIME AS NEEDED ANXIETY  3  . melatonin 10 mg Tab Take by mouth     No current facility-administered medications for this visit.       ALLERGIES: Patient has no known allergies.  PAST MEDICAL HISTORY:     Past Medical History:  Diagnosis Date  . Kidney stones     PAST SURGICAL HISTORY:      Past Surgical History:  Procedure Laterality Date  . CERVICAL BIOPSY  W/ LOOP ELECTRODE EXCISION    . excision of IUD  09/2017  . foot surgery    . kidney stone removal  02/2016     FAMILY HISTORY:      Family History  Problem Relation Age of Onset  . Lung cancer Mother   . Nephrolithiasis Father      SOCIAL HISTORY: Social History          Socioeconomic History  . Marital status: Married    Spouse name: Not on file  . Number of children: Not on file  . Years of education: Not on file  . Highest education level: Not on file  Occupational History  . Not on file  Social Needs  . Financial resource strain: Not on file  . Food insecurity:    Worry: Not on file    Inability: Not on file  . Transportation needs:  Medical: Not on file    Non-medical: Not on file  Tobacco Use  . Smoking status: Current Every Day Smoker  . Smokeless tobacco: Never Used  Substance and Sexual Activity  . Alcohol use: Yes  . Drug use: Never  . Sexual activity: Not on file  Other Topics Concern  . Not on file  Social History Narrative  . Not on file      PHYSICAL EXAM:    Vitals:   05/22/18 1446  BP: 109/69  Pulse: 71  Temp: 36.6 C (97.9 F)   Body mass index is 23.24 kg/m. Weight: 65.3 kg (144 lb)   GENERAL: Alert, active, oriented x3  HEENT: Pupils equal reactive to  light. Extraocular movements are intact. Sclera clear. Palpebral conjunctiva normal red color.Pharynx clear.  NECK: Supple with no palpable mass and no adenopathy.  LUNGS: Sound clear with no rales rhonchi or wheezes.  HEART: Regular rhythm S1 and S2 without murmur.  BREAST: both breast evaluated in the supine and sitting position and appear normal, no suspicious masses, no skin or nipple changes or axillary nodes. There is a fading ecchymosis on the superior part of the left breast.   ABDOMEN: Soft and depressible, nontender with no palpable mass, no hepatomegaly.  EXTREMITIES: Well-developed well-nourished symmetrical with no dependent edema.  NEUROLOGICAL: Awake alert oriented, facial expression symmetrical, moving all extremities.  REVIEW OF DATA: I have reviewed the following data today:      No visits with results within 3 Month(s) from this visit.  Latest known visit with results is:  Initial consult on 11/05/2017  Component Date Value  . Influenza A Antigen 11/05/2017 Negative   . Influenza B Antigen 11/05/2017 Negative      ASSESSMENT: Ms. Stormer is a 47 y.o. female presenting for consultation for a discordant biopsy from imaging study.     Patient was oriented again about the imaging and pathology results and why they were discordant. Patient was oriented of why a surgical excisional biopsy was recommended in her case even when the biopsy results report a benign lesion. Surgical technique and post operative care was discussed with patient. Risk of surgery was discussed with patient including but not limited to: wound infection, seroma, hematoma, brachial plexopathy, mondor's disease (thrombosis of small veins of breast), chronic wound pain, breast lymphedema, altered sensation to the nipple and cosmesis among others.   PLAN: 1. Left breast Needle guided excisional biopsy (19125) 2. CBC, CMP 3. Contact us if has any question or concern  Patient and her husband  verbalized understanding, all questions were answered, and were agreeable with the plan outlined above.     Herbert Pun, MD  Electronically signed by Herbert Pun, MD

## 2018-05-24 NOTE — H&P (Signed)
PATIENT PROFILE: Aleese Kamps is a 47 y.o. female who presents to the Clinic for consultation at the request of Dr. Marcelline Mates for evaluation of discordant biopsy.  PCP:  Evern Bio, NP  HISTORY OF PRESENT ILLNESS: Ms. Tellefsen reports she had her screening mammogram as usual every year. She denies any palpable mass, skin changes, nipple retraction or secretions. She had a screening mammogram on 04/10/18 and was found with an asymmetry that warranted further imaging. Diagnostic mammogram and ultrasound found an irregular mass of 2.1 cm x 0.7 x 1.8 cm located on the left breast at 11:30, 9 cm from the nipple. Patient had ultrasound guided biopsy on 05/16/18 which showed mild periductal fibrosis without atypia or malignancy. This was found to be dicordant by Dr. Owens Shark and excisional biopsy was recommended.   Family history of breast cancer: none Family history of other cancers: mother (lung cancer) Menarche: 70 years of age Menopause: 05/15/18 Used OCP: none Patient had two pregnancies Used estrogen and progesterone therapy: none         History of Radiation to the chest: none  PROBLEM LIST:     Problem List  Date Reviewed: 11/14/2017   None      GENERAL REVIEW OF SYSTEMS:   General ROS: negative for - chills, fatigue, fever, weight gain or weight loss Allergy and Immunology ROS: negative for - hives  Hematological and Lymphatic ROS: negative for - bleeding problems or bruising, negative for palpable nodes Endocrine ROS: negative for - heat or cold intolerance, hair changes Respiratory ROS: negative for - cough, shortness of breath or wheezing Cardiovascular ROS: no chest pain or palpitations. Positive for racing heart or irregular heart beat.  GI ROS: negative for nausea, vomiting, abdominal pain, diarrhea, constipation Musculoskeletal ROS: negative for - joint swelling or muscle pain Neurological ROS: negative for - confusion, syncope Dermatological ROS: negative for pruritus  and rash Psychiatric: negative for anxiety, depression, difficulty sleeping and memory loss  MEDICATIONS: CurrentMedications        Current Outpatient Medications  Medication Sig Dispense Refill  . ALPRAZolam (XANAX) 0.5 MG tablet TAKE 1 TABLET BY MOUTH AT BEDTIME AS NEEDED ANXIETY  3  . melatonin 10 mg Tab Take by mouth     No current facility-administered medications for this visit.       ALLERGIES: Patient has no known allergies.  PAST MEDICAL HISTORY:     Past Medical History:  Diagnosis Date  . Kidney stones     PAST SURGICAL HISTORY:      Past Surgical History:  Procedure Laterality Date  . CERVICAL BIOPSY  W/ LOOP ELECTRODE EXCISION    . excision of IUD  09/2017  . foot surgery    . kidney stone removal  02/2016     FAMILY HISTORY:      Family History  Problem Relation Age of Onset  . Lung cancer Mother   . Nephrolithiasis Father      SOCIAL HISTORY: Social History          Socioeconomic History  . Marital status: Married    Spouse name: Not on file  . Number of children: Not on file  . Years of education: Not on file  . Highest education level: Not on file  Occupational History  . Not on file  Social Needs  . Financial resource strain: Not on file  . Food insecurity:    Worry: Not on file    Inability: Not on file  . Transportation needs:  Medical: Not on file    Non-medical: Not on file  Tobacco Use  . Smoking status: Current Every Day Smoker  . Smokeless tobacco: Never Used  Substance and Sexual Activity  . Alcohol use: Yes  . Drug use: Never  . Sexual activity: Not on file  Other Topics Concern  . Not on file  Social History Narrative  . Not on file      PHYSICAL EXAM:    Vitals:   05/22/18 1446  BP: 109/69  Pulse: 71  Temp: 36.6 C (97.9 F)   Body mass index is 23.24 kg/m. Weight: 65.3 kg (144 lb)   GENERAL: Alert, active, oriented x3  HEENT: Pupils equal reactive to  light. Extraocular movements are intact. Sclera clear. Palpebral conjunctiva normal red color.Pharynx clear.  NECK: Supple with no palpable mass and no adenopathy.  LUNGS: Sound clear with no rales rhonchi or wheezes.  HEART: Regular rhythm S1 and S2 without murmur.  BREAST: both breast evaluated in the supine and sitting position and appear normal, no suspicious masses, no skin or nipple changes or axillary nodes. There is a fading ecchymosis on the superior part of the left breast.   ABDOMEN: Soft and depressible, nontender with no palpable mass, no hepatomegaly.  EXTREMITIES: Well-developed well-nourished symmetrical with no dependent edema.  NEUROLOGICAL: Awake alert oriented, facial expression symmetrical, moving all extremities.  REVIEW OF DATA: I have reviewed the following data today:      No visits with results within 3 Month(s) from this visit.  Latest known visit with results is:  Initial consult on 11/05/2017  Component Date Value  . Influenza A Antigen 11/05/2017 Negative   . Influenza B Antigen 11/05/2017 Negative      ASSESSMENT: Ms. Scheibe is a 47 y.o. female presenting for consultation for a discordant biopsy from imaging study.     Patient was oriented again about the imaging and pathology results and why they were discordant. Patient was oriented of why a surgical excisional biopsy was recommended in her case even when the biopsy results report a benign lesion. Surgical technique and post operative care was discussed with patient. Risk of surgery was discussed with patient including but not limited to: wound infection, seroma, hematoma, brachial plexopathy, mondor's disease (thrombosis of small veins of breast), chronic wound pain, breast lymphedema, altered sensation to the nipple and cosmesis among others.   PLAN: 1. Left breast Needle guided excisional biopsy (19125) 2. CBC, CMP 3. Contact us if has any question or concern  Patient and her husband  verbalized understanding, all questions were answered, and were agreeable with the plan outlined above.     Herbert Pun, MD  Electronically signed by Herbert Pun, MD

## 2018-05-28 ENCOUNTER — Other Ambulatory Visit: Payer: Self-pay

## 2018-05-28 ENCOUNTER — Encounter
Admission: RE | Admit: 2018-05-28 | Discharge: 2018-05-28 | Disposition: A | Discharge status: Home / Self Care | Payer: BC Managed Care – PPO | Source: Ambulatory Visit | Attending: General Surgery | Admitting: General Surgery

## 2018-05-28 NOTE — Patient Instructions (Signed)
Your procedure is scheduled on: 05-31-18 THURSDAY Report to [place] @ 7:45 AM  Remember: Instructions that are not followed completely may result in serious medical risk, up to and including death, or upon the discretion of your surgeon and anesthesiologist your surgery may need to be rescheduled.    _x___ 1. Do not eat food after midnight the night before your procedure. NO GUM OR CANDY AFTER MIDNIGHT.  You may drink clear liquids up to 2 hours before you are scheduled to arrive at the hospital for your procedure.  Do not drink clear liquids within 2 hours of your scheduled arrival to the hospital.  Clear liquids include  --Water or Apple juice without pulp  --Clear carbohydrate beverage such as ClearFast or Gatorade  --Black Coffee or Clear Tea (No milk, no creamers, do not add anything to the coffee or Tea      __x__ 2. No Alcohol for 24 hours before or after surgery.   __x__3. No Smoking or e-cigarettes for 24 prior to surgery.  Do not use any chewable tobacco products for at least 6 hour prior to surgery   ____  4. Bring all medications with you on the day of surgery if instructed.    __x__ 5. Notify your doctor if there is any change in your medical condition     (cold, fever, infections).    x___6. On the morning of surgery brush your teeth with toothpaste and water.  You may rinse your mouth with mouth wash if you wish.  Do not swallow any toothpaste or mouthwash.   Do not wear jewelry, make-up, hairpins, clips or nail polish.  Do not wear lotions, powders, or perfumes. You may wear deodorant.  Do not shave 48 hours prior to surgery. Men may shave face and neck.  Do not bring valuables to the hospital.    [facility] is not responsible for any belongings or valuables.               Contacts, dentures or bridgework may not be worn into surgery.  Leave your suitcase in the car. After surgery it may be brought to your room.  For patients admitted to the hospital,  discharge time is determined by your treatment team.  _  Patients discharged the day of surgery will not be allowed to drive home.  You will need someone to drive you home and stay with you the night of your procedure.    Please read over the following fact sheets that you were given:   [facility] Preparing for Surgery   _x___ Take anti-hypertensive listed below, cardiac, seizure, asthma, anti-reflux and psychiatric medicines. These include:  1. YOU MAY TAKE YOUR XANAX AM OF SURGERY IF NEEDED WITH A SMALL SIP OF WATER  2.  3.  4.  5.  6.  ____Fleets enema or Magnesium Citrate as directed.   ____ Use CHG Soap or sage wipes as directed on instruction sheet   ____ Use inhalers on the day of surgery and bring to hospital day of surgery  ____ Stop Metformin and Janumet 2 days prior to surgery.    ____ Take 1/2 of usual insulin dose the night before surgery and none on the morning surgery.   ____ Follow recommendations from Cardiologist, Pulmonologist or PCP regarding stopping Aspirin, Coumadin, Plavix ,Eliquis, Effient, or Pradaxa, and Pletal.  X____Stop Anti-inflammatories such as Advil, Aleve, Ibuprofen, Motrin, Naproxen, Naprosyn, Goodies powders or aspirin products NOW-OK to take Tylenol    _x___  Stop supplements until after surgery-STOP MELATONIN NOW-MAY RESUME AFTER SURGERY   ____ Bring C-Pap to the hospital.

## 2018-05-31 ENCOUNTER — Other Ambulatory Visit: Payer: Self-pay | Admitting: General Surgery

## 2018-05-31 ENCOUNTER — Ambulatory Visit: Payer: BC Managed Care – PPO | Admitting: Certified Registered"

## 2018-05-31 ENCOUNTER — Ambulatory Visit
Admission: RE | Admit: 2018-05-31 | Discharge: 2018-05-31 | Disposition: A | Discharge status: Home / Self Care | Payer: BC Managed Care – PPO | Source: Ambulatory Visit | Attending: General Surgery | Admitting: General Surgery

## 2018-05-31 ENCOUNTER — Other Ambulatory Visit: Payer: Self-pay

## 2018-05-31 ENCOUNTER — Encounter
Admission: RE | Disposition: A | Discharge status: Home / Self Care | Payer: Self-pay | Source: Ambulatory Visit | Attending: General Surgery

## 2018-05-31 DIAGNOSIS — K219 Gastro-esophageal reflux disease without esophagitis: Secondary | ICD-10-CM | POA: Insufficient documentation

## 2018-05-31 DIAGNOSIS — R928 Other abnormal and inconclusive findings on diagnostic imaging of breast: Secondary | ICD-10-CM

## 2018-05-31 DIAGNOSIS — F17200 Nicotine dependence, unspecified, uncomplicated: Secondary | ICD-10-CM | POA: Diagnosis not present

## 2018-05-31 DIAGNOSIS — N6320 Unspecified lump in the left breast, unspecified quadrant: Secondary | ICD-10-CM | POA: Diagnosis present

## 2018-05-31 DIAGNOSIS — F419 Anxiety disorder, unspecified: Secondary | ICD-10-CM | POA: Insufficient documentation

## 2018-05-31 DIAGNOSIS — N62 Hypertrophy of breast: Secondary | ICD-10-CM | POA: Insufficient documentation

## 2018-05-31 DIAGNOSIS — N6032 Fibrosclerosis of left breast: Secondary | ICD-10-CM | POA: Diagnosis not present

## 2018-05-31 DIAGNOSIS — F329 Major depressive disorder, single episode, unspecified: Secondary | ICD-10-CM | POA: Diagnosis not present

## 2018-05-31 DIAGNOSIS — Z79899 Other long term (current) drug therapy: Secondary | ICD-10-CM | POA: Insufficient documentation

## 2018-05-31 HISTORY — PX: BREAST BIOPSY: SHX20

## 2018-05-31 LAB — POCT PREGNANCY, URINE: Preg Test, Ur: NEGATIVE

## 2018-05-31 SURGERY — BREAST BIOPSY WITH NEEDLE LOCALIZATION
Anesthesia: General | Site: Breast | Laterality: Left | Wound class: Clean

## 2018-05-31 MED ORDER — FENTANYL CITRATE (PF) 100 MCG/2ML IJ SOLN
INTRAMUSCULAR | Status: AC
  Filled 2018-05-31: qty 2

## 2018-05-31 MED ORDER — OXYCODONE HCL 5 MG/5ML PO SOLN: 5.0000 mg | Freq: Once | ORAL | Status: DC | PRN

## 2018-05-31 MED ORDER — LIDOCAINE HCL (CARDIAC) PF 100 MG/5ML IV SOSY
PREFILLED_SYRINGE | INTRAVENOUS | Status: DC | PRN
  Administered 2018-05-31: 80 mg via INTRAVENOUS

## 2018-05-31 MED ORDER — ACETAMINOPHEN 160 MG/5ML PO SOLN
325.0000 mg | ORAL | Status: DC | PRN
  Filled 2018-05-31: qty 20.3

## 2018-05-31 MED ORDER — ONDANSETRON HCL 4 MG/2ML IJ SOLN
INTRAMUSCULAR | Status: DC | PRN
  Administered 2018-05-31: 4 mg via INTRAVENOUS

## 2018-05-31 MED ORDER — ONDANSETRON HCL 4 MG/2ML IJ SOLN
INTRAMUSCULAR | Status: AC
  Filled 2018-05-31: qty 2

## 2018-05-31 MED ORDER — EPHEDRINE SULFATE 50 MG/ML IJ SOLN
INTRAMUSCULAR | Status: DC | PRN
  Administered 2018-05-31 (×3): 5 mg via INTRAVENOUS

## 2018-05-31 MED ORDER — HYDROCODONE-ACETAMINOPHEN 5-325 MG PO TABS: 1.0000 | ORAL_TABLET | ORAL | 0 refills | Status: AC | PRN

## 2018-05-31 MED ORDER — DEXAMETHASONE SODIUM PHOSPHATE 10 MG/ML IJ SOLN
INTRAMUSCULAR | Status: AC
  Filled 2018-05-31: qty 1

## 2018-05-31 MED ORDER — LIDOCAINE HCL (PF) 2 % IJ SOLN
INTRAMUSCULAR | Status: AC
  Filled 2018-05-31: qty 10

## 2018-05-31 MED ORDER — PROPOFOL 10 MG/ML IV BOLUS
INTRAVENOUS | Status: DC | PRN
  Administered 2018-05-31: 140 mg via INTRAVENOUS

## 2018-05-31 MED ORDER — FAMOTIDINE 20 MG PO TABS
20.0000 mg | ORAL_TABLET | Freq: Once | ORAL | Status: AC
  Administered 2018-05-31: 20 mg via ORAL

## 2018-05-31 MED ORDER — CEFAZOLIN SODIUM-DEXTROSE 2-4 GM/100ML-% IV SOLN
INTRAVENOUS | Status: AC
  Filled 2018-05-31: qty 100

## 2018-05-31 MED ORDER — MIDAZOLAM HCL 2 MG/2ML IJ SOLN
INTRAMUSCULAR | Status: AC
  Filled 2018-05-31: qty 2

## 2018-05-31 MED ORDER — HYDROMORPHONE HCL 1 MG/ML IJ SOLN: 0.2500 mg | INTRAMUSCULAR | Status: DC | PRN

## 2018-05-31 MED ORDER — MIDAZOLAM HCL 2 MG/2ML IJ SOLN
INTRAMUSCULAR | Status: DC | PRN
  Administered 2018-05-31: 2 mg via INTRAVENOUS

## 2018-05-31 MED ORDER — PROMETHAZINE HCL 25 MG/ML IJ SOLN: 6.2500 mg | INTRAMUSCULAR | Status: DC | PRN

## 2018-05-31 MED ORDER — MEPERIDINE HCL 50 MG/ML IJ SOLN: 6.2500 mg | INTRAMUSCULAR | Status: DC | PRN

## 2018-05-31 MED ORDER — FAMOTIDINE 20 MG PO TABS
ORAL_TABLET | ORAL | Status: AC
  Filled 2018-05-31: qty 1

## 2018-05-31 MED ORDER — ACETAMINOPHEN 325 MG PO TABS: 325.0000 mg | ORAL_TABLET | ORAL | Status: DC | PRN

## 2018-05-31 MED ORDER — CEFAZOLIN SODIUM-DEXTROSE 2-4 GM/100ML-% IV SOLN
2.0000 g | INTRAVENOUS | Status: AC
  Administered 2018-05-31: 2 g via INTRAVENOUS

## 2018-05-31 MED ORDER — LACTATED RINGERS IV SOLN
INTRAVENOUS | Status: DC
  Administered 2018-05-31: 09:00:00 via INTRAVENOUS

## 2018-05-31 MED ORDER — BUPIVACAINE-EPINEPHRINE (PF) 0.5% -1:200000 IJ SOLN
INTRAMUSCULAR | Status: DC | PRN
  Administered 2018-05-31: 30 mL

## 2018-05-31 MED ORDER — OXYCODONE HCL 5 MG PO TABS: 5.0000 mg | ORAL_TABLET | Freq: Once | ORAL | Status: DC | PRN

## 2018-05-31 MED ORDER — FENTANYL CITRATE (PF) 100 MCG/2ML IJ SOLN
INTRAMUSCULAR | Status: DC | PRN
  Administered 2018-05-31 (×2): 50 ug via INTRAVENOUS

## 2018-05-31 MED ORDER — PROPOFOL 10 MG/ML IV BOLUS
INTRAVENOUS | Status: AC
  Filled 2018-05-31: qty 20

## 2018-05-31 MED ORDER — DEXAMETHASONE SODIUM PHOSPHATE 10 MG/ML IJ SOLN
INTRAMUSCULAR | Status: DC | PRN
  Administered 2018-05-31: 8 mg via INTRAVENOUS

## 2018-05-31 SURGICAL SUPPLY — 48 items
BINDER BREAST LRG (GAUZE/BANDAGES/DRESSINGS) IMPLANT
BINDER BREAST MEDIUM (GAUZE/BANDAGES/DRESSINGS) ×2 IMPLANT
BINDER BREAST XLRG (GAUZE/BANDAGES/DRESSINGS) IMPLANT
BINDER BREAST XXLRG (GAUZE/BANDAGES/DRESSINGS) IMPLANT
BLADE SURG 15 STRL SS SAFETY (BLADE) IMPLANT
CANISTER SUCT 1200ML W/VALVE (MISCELLANEOUS) ×2 IMPLANT
CHLORAPREP W/TINT 26ML (MISCELLANEOUS) ×2 IMPLANT
CNTNR SPEC 2.5X3XGRAD LEK (MISCELLANEOUS)
CONT SPEC 4OZ STER OR WHT (MISCELLANEOUS)
CONTAINER SPEC 2.5X3XGRAD LEK (MISCELLANEOUS) IMPLANT
COVER PROBE FLX POLY STRL (MISCELLANEOUS) IMPLANT
DERMABOND ADVANCED (GAUZE/BANDAGES/DRESSINGS) ×1
DERMABOND ADVANCED .7 DNX12 (GAUZE/BANDAGES/DRESSINGS) ×1 IMPLANT
DEVICE DUBIN SPECIMEN MAMMOGRA (MISCELLANEOUS) ×2 IMPLANT
DRAPE LAPAROTOMY TRNSV 106X77 (MISCELLANEOUS) ×2 IMPLANT
DRSG TELFA 3X8 NADH (GAUZE/BANDAGES/DRESSINGS) IMPLANT
ELECT CAUTERY BLADE TIP 2.5 (TIP) ×2
ELECT REM PT RETURN 9FT ADLT (ELECTROSURGICAL) ×2
ELECTRODE CAUTERY BLDE TIP 2.5 (TIP) ×1 IMPLANT
ELECTRODE REM PT RTRN 9FT ADLT (ELECTROSURGICAL) ×1 IMPLANT
GAUZE SPONGE 4X4 12PLY STRL (GAUZE/BANDAGES/DRESSINGS) IMPLANT
GLOVE BIO SURGEON STRL SZ 6.5 (GLOVE) ×6 IMPLANT
GOWN STRL REUS W/ TWL LRG LVL3 (GOWN DISPOSABLE) ×3 IMPLANT
GOWN STRL REUS W/TWL LRG LVL3 (GOWN DISPOSABLE) ×3
KIT TURNOVER KIT A (KITS) ×2 IMPLANT
LABEL OR SOLS (LABEL) ×2 IMPLANT
MARGIN MAP 10MM (MISCELLANEOUS) ×2 IMPLANT
NEEDLE HYPO 22GX1.5 SAFETY (NEEDLE) ×2 IMPLANT
NEEDLE HYPO 25X1 1.5 SAFETY (NEEDLE) IMPLANT
PACK BASIN MINOR ARMC (MISCELLANEOUS) ×2 IMPLANT
SLEVE PROBE SENORX GAMMA FIND (MISCELLANEOUS) IMPLANT
SUT ETHILON 3-0 FS-10 30 BLK (SUTURE) ×2
SUT MNCRL AB 4-0 PS2 18 (SUTURE) ×2 IMPLANT
SUT PROLENE 2 0 FS (SUTURE) ×2 IMPLANT
SUT SILK 2 0 (SUTURE) ×1
SUT SILK 2 0 SH (SUTURE) ×2 IMPLANT
SUT SILK 2-0 18XBRD TIE 12 (SUTURE) ×1 IMPLANT
SUT VIC AB 2-0 CT1 27 (SUTURE)
SUT VIC AB 2-0 CT1 TAPERPNT 27 (SUTURE) IMPLANT
SUT VIC AB 3-0 SH 27 (SUTURE)
SUT VIC AB 3-0 SH 27X BRD (SUTURE) IMPLANT
SUT VIC AB 4-0 FS2 27 (SUTURE) IMPLANT
SUT VICRYL+ 3-0 144IN (SUTURE) ×2 IMPLANT
SUTURE EHLN 3-0 FS-10 30 BLK (SUTURE) ×1 IMPLANT
SYR 10ML LL (SYRINGE) ×2 IMPLANT
SYR BULB IRRIG 60ML STRL (SYRINGE) ×2 IMPLANT
SYR CONTROL 10ML (SYRINGE) IMPLANT
WATER STERILE IRR 1000ML POUR (IV SOLUTION) ×2 IMPLANT

## 2018-05-31 NOTE — Anesthesia Procedure Notes (Signed)
Procedure Name: LMA Insertion Performed by: Conchita Truxillo, CRNA Pre-anesthesia Checklist: Patient identified, Patient being monitored, Timeout performed, Emergency Drugs available and Suction available Patient Re-evaluated:Patient Re-evaluated prior to induction Oxygen Delivery Method: Circle system utilized Preoxygenation: Pre-oxygenation with 100% oxygen Induction Type: IV induction Ventilation: Mask ventilation without difficulty LMA: LMA inserted LMA Size: 3.5 Tube type: Oral Number of attempts: 1 Placement Confirmation: positive ETCO2 and breath sounds checked- equal and bilateral Tube secured with: Tape Dental Injury: Teeth and Oropharynx as per pre-operative assessment        

## 2018-05-31 NOTE — Transfer of Care (Signed)
Immediate Anesthesia Transfer of Care Note  Patient: Jasmine Hodges  Procedure(s) Performed: BREAST BIOPSY WITH NEEDLE LOCALIZATION (Left Breast)  Patient Location: PACU  Anesthesia Type:General  Level of Consciousness: awake, alert , oriented and patient cooperative  Airway & Oxygen Therapy: Patient Spontanous Breathing and Patient connected to face mask oxygen  Post-op Assessment: Report given to RN and Post -op Vital signs reviewed and stable  Post vital signs: Reviewed and stable  Last Vitals:  Vitals Value Taken Time  BP 106/58 05/31/2018 11:29 AM  Temp 36.7 C 05/31/2018 11:29 AM  Pulse 78 05/31/2018 11:31 AM  Resp 12 05/31/2018 11:31 AM  SpO2 100 % 05/31/2018 11:31 AM  Vitals shown include unvalidated device data.  Last Pain:  Vitals:   05/31/18 0844  TempSrc: Temporal         Complications: No apparent anesthesia complications

## 2018-05-31 NOTE — Brief Op Note (Signed)
05/31/2018  11:35 AM  PATIENT:  Jasmine Hodges  47 y.o. female  PRE-OPERATIVE DIAGNOSIS:  ABNORMAL MAMMOGRAM OF LEFT BREAST  POST-OPERATIVE DIAGNOSIS:  ABNORMAL MAMMOGRAM OF LEFT BREAST  PROCEDURE:  Procedure(s): BREAST BIOPSY WITH NEEDLE LOCALIZATION (Left)  SURGEON:  Surgeon(s) and Role:    Herbert Pun, MD - Primary  ANESTHESIA:   local and general(LMA)  EBL:  5 mL

## 2018-05-31 NOTE — Interval H&P Note (Signed)
History and Physical Interval Note:  05/31/2018 9:40 AM  Jasmine Hodges  has presented today for surgery, with the diagnosis of ABNORMAL MAMMOGRAM OF LEFT BREAST  The various methods of treatment have been discussed with the patient and family. After consideration of risks, benefits and other options for treatment, the patient has consented to  Procedure(s): BREAST BIOPSY WITH NEEDLE LOCALIZATION (Left) as a surgical intervention .  The patient's history has been reviewed, patient examined, no change in status, stable for surgery.  I have reviewed the patient's chart and labs.  Left side marked in the pre procedure room. Questions were answered to the patient's satisfaction.     Herbert Pun

## 2018-05-31 NOTE — Discharge Instructions (Signed)
°  Diet: Resume home heart healthy regular diet.   Activity: No heavy lifting >40 pounds (children, pets, laundry, garbage) or strenuous if having pain or discomfort, but light activity and walking are encouraged. Do not drive or drink alcohol if taking narcotic pain medications.  Wound care: May shower with soapy water and pat dry (do not rub incisions), but no baths or submerging incision underwater until wound is closed. (no swimming)   Medications: Resume all home medications. For mild to moderate pain: acetaminophen (Tylenol) or ibuprofen (if no kidney disease). Combining Tylenol with alcohol can substantially increase your risk of causing liver disease. Narcotic pain medications, if prescribed, can be used for severe pain, though may cause nausea, constipation, and drowsiness. Do not combine Tylenol and Norco within a 6 hour period as Norco contains Tylenol. If you do not need the narcotic pain medication, you do not need to fill the prescription.  Call office [phone]) at any time if any questions, worsening pain, fevers/chills, bleeding, drainage from incision site, or other concerns.  AMBULATORY SURGERY  DISCHARGE INSTRUCTIONS   1) The drugs that you were given will stay in your system until tomorrow so for the next 24 hours you should not:  A) Drive an automobile B) Make any legal decisions C) Drink any alcoholic beverage   2) You may resume regular meals tomorrow.  Today it is better to start with liquids and gradually work up to solid foods.  You may eat anything you prefer, but it is better to start with liquids, then soup and crackers, and gradually work up to solid foods.   3) Please notify your doctor immediately if you have any unusual bleeding, trouble breathing, redness and pain at the surgery site, drainage, fever, or pain not relieved by medication.    4) Additional Instructions:        Please contact your physician with any problems or Same Day Surgery  at [phone], Monday through Friday 6 am to 4 pm, or [place] at [facility] number at [phone].

## 2018-05-31 NOTE — Anesthesia Post-op Follow-up Note (Signed)
Anesthesia QCDR form completed.        

## 2018-05-31 NOTE — Anesthesia Preprocedure Evaluation (Signed)
Anesthesia Evaluation  Patient identified by MRN, date of birth, ID band Patient awake    Reviewed: Allergy & Precautions, H&P , NPO status , reviewed documented beta blocker date and time   Airway Mallampati: II  TM Distance: >3 FB Neck ROM: full    Dental  (+) Teeth Intact   Pulmonary Current Smoker,    Pulmonary exam normal        Cardiovascular Normal cardiovascular exam     Neuro/Psych PSYCHIATRIC DISORDERS Anxiety Depression    GI/Hepatic GERD  Medicated and Controlled,  Endo/Other    Renal/GU Renal disease     Musculoskeletal   Abdominal   Peds  Hematology   Anesthesia Other Findings Past Medical History: No date: Anxiety No date: CIN II (cervical intraepithelial neoplasia II) No date: Depression No date: GERD (gastroesophageal reflux disease)     Comment:  h/o No date: History of abnormal cells from cervix No date: History of kidney stones No date: LGSIL (low grade squamous intraepithelial dysplasia) No date: Reflux gastritis No date: Sleep disturbance No date: Vaginal Pap smear, abnormal No date: Vitamin D deficiency Past Surgical History: 05/08/2018: BREAST BIOPSY; Left     Comment:  path pending No date: COLPOSCOPY W/ BIOPSY / CURETTAGE 04/11/2016: CYSTOSCOPY W/ RETROGRADES; Bilateral     Comment:  Procedure: CYSTOSCOPY WITH RETROGRADE PYELOGRAM;                Surgeon: Hollice Espy, MD;  Location: [facility] ORS;                Service: Urology;  Laterality: Bilateral; 04/11/2016: CYSTOSCOPY/URETEROSCOPY/HOLMIUM LASER/STENT PLACEMENT; Left     Comment:  Procedure: CYSTOSCOPY/URETEROSCOPY/HOLMIUM LASER/STENT               PLACEMENT;  Surgeon: Hollice Espy, MD;  Location: [facility]               ORS;  Service: Urology;  Laterality: Left; 2010: FOOT SURGERY; Left 09/22/2017: HYSTEROSCOPY; N/A     Comment:  Procedure: HYSTEROSCOPY;  Surgeon: Rubie Maid, MD;                Location: [facility] ORS;  Service:  Gynecology;  Laterality:               N/A; 09/22/2017: IUD REMOVAL; N/A     Comment:  Procedure: INTRAUTERINE DEVICE (IUD) REMOVAL;  Surgeon:               Rubie Maid, MD;  Location: [facility] ORS;  Service:               Gynecology;  Laterality: N/A; 02/2015: LEEP 04/11/2016: STONE EXTRACTION WITH BASKET; Left     Comment:  Procedure: STONE EXTRACTION WITH BASKET;  Surgeon:               Hollice Espy, MD;  Location: [facility] ORS;  Service:               Urology;  Laterality: Left; BMI    Body Mass Index:  23.40 kg/m     Reproductive/Obstetrics                             Anesthesia Physical Anesthesia Plan  ASA: II  Anesthesia Plan: General LMA   Post-op Pain Management:    Induction: Intravenous  PONV Risk Score and Plan: 3 and Ondansetron and Treatment may vary due to age or medical condition  Airway Management Planned:   Additional  Equipment:   Intra-op Plan:   Post-operative Plan: Extubation in OR  Informed Consent: I have reviewed the patients History and Physical, chart, labs and discussed the procedure including the risks, benefits and alternatives for the proposed anesthesia with the patient or authorized representative who has indicated his/her understanding and acceptance.   Dental Advisory Given  Plan Discussed with: CRNA  Anesthesia Plan Comments:         Anesthesia Quick Evaluation

## 2018-05-31 NOTE — Op Note (Signed)
Preoperative diagnosis: Left breast mass discordant with pathology results.  Postoperative diagnosis: Left breast mass discordant with pathology results..   Procedure: Left needle-localized breast lumpectomy.   Anesthesia: Local and General (LMA)  Surgeon: Dr. Windell Moment  Wound Classification: Clean  Indications: Patient is a 47 y.o. female with a nonpalpable left breast mass noted on mammography with core biopsy demonstrating periductal fibrosis which was discordant with mammographic characteristics. Patient requires needle-localized lumpectomy as an excisional biopsy to confirm diagnosis.   Findings: 1. Specimen mammography shows marker and wire on specimen 2. Pathology call refers gross examination show hemorrhagic changes with no discrete mass 3. No other palpable mass or lymph node identified.   Description of procedure: Preoperative needle localization was performed by radiology. Localization studies were reviewed. The patient was taken to the operating room and placed supine on the operating table, and after general anesthesia the left chest and axilla were prepped and draped in the usual sterile fashion. A time-out was completed verifying correct patient, procedure, site, positioning, and implant(s) and/or special equipment prior to beginning this procedure.  By comparing the localization studies with the direction and skin entry site of the needle, the probable trajectory and location of the mass was visualized. A circumareolar skin incision was planned in such a way as to minimize the amount of dissection to reach the mass.  The skin incision was made. Flaps were raised and the location of the wire confirmed. The wire was delivered into the wound. A 2-0 silk figure-of-eight stay suture was placed around the wire and used for retraction. Dissection was then taken down circumferentially, taking care to include the entire localizing needle and a wide margin of grossly normal  tissue. The specimen and entire localizing wire were removed. The specimen was oriented and sent to radiology with the localization studies. Confirmation was received that the entire target lesion had been resected. The wound was irrigated. Hemostasis was checked. The wound was closed with interrupted sutures of 3-0 Vicryl and a subcuticular suture of Monocryl 4-0. No attempt was made to close the dead space. Dermabond was applied.  The patient tolerated the procedure well and was taken to the postanesthesia care unit in stable condition.   Specimen: Left Breast mass (Orientation markers used: Cranial, Caudal, Medial, Lateral,Skin, Deep)                   Complications: None  Estimated Blood Loss: 5 mL

## 2018-06-01 ENCOUNTER — Encounter: Payer: Self-pay | Admitting: General Surgery

## 2018-06-01 LAB — SURGICAL PATHOLOGY

## 2018-06-11 NOTE — Anesthesia Postprocedure Evaluation (Signed)
Anesthesia Post Note  Patient: Jasmine Hodges  Procedure(s) Performed: BREAST BIOPSY WITH NEEDLE LOCALIZATION (Left Breast)  Patient location during evaluation: PACU Anesthesia Type: General Level of consciousness: awake and alert Pain management: pain level controlled Vital Signs Assessment: post-procedure vital signs reviewed and stable Respiratory status: spontaneous breathing, nonlabored ventilation and respiratory function stable Cardiovascular status: blood pressure returned to baseline and stable Postop Assessment: no apparent nausea or vomiting Anesthetic complications: no     Last Vitals:  Vitals:   05/31/18 1159 05/31/18 1203  BP: 97/60 (!) 99/52  Pulse: 70 65  Resp: 20 16  Temp: 36.9 C 36.7 C  SpO2: 100% 100%    Last Pain:  Vitals:   05/31/18 1203  TempSrc: Oral  PainSc: 0-No pain                 Alphonsus Sias

## 2018-06-21 ENCOUNTER — Encounter: Payer: BC Managed Care – PPO | Admitting: Obstetrics and Gynecology

## 2018-06-26 ENCOUNTER — Encounter: Payer: Self-pay | Admitting: Obstetrics and Gynecology

## 2018-06-26 ENCOUNTER — Ambulatory Visit (INDEPENDENT_AMBULATORY_CARE_PROVIDER_SITE_OTHER): Payer: BC Managed Care – PPO | Admitting: Obstetrics and Gynecology

## 2018-06-26 ENCOUNTER — Other Ambulatory Visit (HOSPITAL_COMMUNITY)
Admission: RE | Admit: 2018-06-26 | Discharge: 2018-06-26 | Disposition: A | Discharge status: Home / Self Care | Payer: BC Managed Care – PPO | Source: Ambulatory Visit | Attending: Obstetrics and Gynecology | Admitting: Obstetrics and Gynecology

## 2018-06-26 VITALS — BP 115/76 | HR 66 | Ht 67.0 in | Wt 149.4 lb

## 2018-06-26 DIAGNOSIS — Z13220 Encounter for screening for lipoid disorders: Secondary | ICD-10-CM | POA: Diagnosis not present

## 2018-06-26 DIAGNOSIS — Z9889 Other specified postprocedural states: Secondary | ICD-10-CM | POA: Diagnosis not present

## 2018-06-26 DIAGNOSIS — Z01419 Encounter for gynecological examination (general) (routine) without abnormal findings: Secondary | ICD-10-CM | POA: Insufficient documentation

## 2018-06-26 DIAGNOSIS — Z87898 Personal history of other specified conditions: Secondary | ICD-10-CM | POA: Insufficient documentation

## 2018-06-26 DIAGNOSIS — Z8742 Personal history of other diseases of the female genital tract: Secondary | ICD-10-CM

## 2018-06-26 NOTE — Patient Instructions (Signed)

## 2018-06-26 NOTE — Progress Notes (Signed)
GYNECOLOGY ANNUAL PHYSICAL EXAM PROGRESS NOTE  Subjective:    Jasmine Hodges is a 47 y.o. [id number] female who presents for an annual exam. The patient has no complaints today. The patient is sexually active. The patient wears seatbelts: yes. The patient participates in regular exercise: no. Has the patient ever been transfused or tattooed?: no. The patient reports that there is not domestic violence in her life.    Gynecologic History Menarche age: 68 Patient's last menstrual period was 06/06/2018. Normal  And regular menstrual cycles, moderate flow.  Contraception: rhythm method History of STI's: Denies Last Pap :06/20/2017. Results were: normal. H/o LGSIL pap followed by normal colposcopy and negative biopsies in 2017.  Notes h/o abnormal pap smears in the past. Has h/o LEEP. Last mammogram: 04/10/2018. Results were: abnormal, mass in upper inner quadrant of right breast. Followed by right breast lumpectomy (benign) .    OB History  Gravida Para Term Preterm AB Living  2 2 2  0 0 2  SAB TAB Ectopic Multiple Live Births  0 0 0 0 2    # Outcome Date GA Lbr Len/2nd Weight Sex Delivery Anes PTL Lv  2 Term 2002    F Vag-Spont   LIV  1 Term 1999    M Vag-Spont   LIV    Past Medical History:  Diagnosis Date  . Anxiety   . CIN II (cervical intraepithelial neoplasia II)   . Depression   . GERD (gastroesophageal reflux disease)    h/o  . History of abnormal cells from cervix   . History of kidney stones   . LGSIL (low grade squamous intraepithelial dysplasia)   . Reflux gastritis   . Sleep disturbance   . Vaginal Pap smear, abnormal   . Vitamin D deficiency     Past Surgical History:  Procedure Laterality Date  . BREAST BIOPSY Left 05/08/2018   path pending  . BREAST BIOPSY Left 05/31/2018   Procedure: BREAST BIOPSY WITH NEEDLE LOCALIZATION;  Surgeon: Herbert Pun, MD;  Location: [facility];  Service: General;  Laterality: Left;  . COLPOSCOPY W/ BIOPSY / CURETTAGE    .  CYSTOSCOPY W/ RETROGRADES Bilateral 04/11/2016   Procedure: CYSTOSCOPY WITH RETROGRADE PYELOGRAM;  Surgeon: Hollice Espy, MD;  Location: [facility];  Service: Urology;  Laterality: Bilateral;  . CYSTOSCOPY/URETEROSCOPY/HOLMIUM LASER/STENT PLACEMENT Left 04/11/2016   Procedure: CYSTOSCOPY/URETEROSCOPY/HOLMIUM LASER/STENT PLACEMENT;  Surgeon: Hollice Espy, MD;  Location: [facility];  Service: Urology;  Laterality: Left;  . FOOT SURGERY Left 2010  . HYSTEROSCOPY N/A 09/22/2017   Procedure: HYSTEROSCOPY;  Surgeon: Rubie Maid, MD;  Location: [facility];  Service: Gynecology;  Laterality: N/A;  . IUD REMOVAL N/A 09/22/2017   Procedure: INTRAUTERINE DEVICE (IUD) REMOVAL;  Surgeon: Rubie Maid, MD;  Location: [facility];  Service: Gynecology;  Laterality: N/A;  . LEEP  02/2015  . STONE EXTRACTION WITH BASKET Left 04/11/2016   Procedure: STONE EXTRACTION WITH BASKET;  Surgeon: Hollice Espy, MD;  Location: [facility];  Service: Urology;  Laterality: Left;    Family History  Problem Relation Age of Onset  . Lung cancer Mother   . Hypertension Father   . Kidney Stones Father   . Diabetes Father   . Kidney disease Neg Hx   . Bladder Cancer Neg Hx   . Breast cancer Neg Hx     Social History   Socioeconomic History  . Marital status: Married    Spouse name: Not on file  . Number of children: Not on  file  . Years of education: Not on file  . Highest education level: Not on file  Occupational History  . Not on file  Social Needs  . Financial resource strain: Not on file  . Food insecurity:    Worry: Not on file    Inability: Not on file  . Transportation needs:    Medical: Not on file    Non-medical: Not on file  Tobacco Use  . Smoking status: Current Every Day Smoker    Packs/day: 0.50    Years: 20.00    Pack years: 10.00    Types: Cigarettes    Last attempt to quit: 01/30/2013    Years since quitting: 5.4  . Smokeless tobacco: Never Used  Substance and Sexual Activity  . Alcohol use:  Yes    Alcohol/week: 1.8 oz    Types: 3 Glasses of wine per week    Comment: occas wine  . Drug use: No  . Sexual activity: Yes  Lifestyle  . Physical activity:    Days per week: Not on file    Minutes per session: Not on file  . Stress: Not on file  Relationships  . Social connections:    Talks on phone: Not on file    Gets together: Not on file    Attends religious service: Not on file    Active member of club or organization: Not on file    Attends meetings of clubs or organizations: Not on file    Relationship status: Not on file  . Intimate partner violence:    Fear of current or ex partner: Not on file    Emotionally abused: Not on file    Physically abused: Not on file    Forced sexual activity: Not on file  Other Topics Concern  . Not on file  Social History Narrative  . Not on file    Current Outpatient Medications on File Prior to Visit  Medication Sig Dispense Refill  . ALPRAZolam (XANAX) 0.5 MG tablet Take 1 tablet (0.5 mg total) by mouth at bedtime as needed for anxiety. 30 tablet 3  . Melatonin 10 MG TABS Take 10 mg by mouth at bedtime.     . Turmeric 500 MG CAPS Take by mouth.     No current facility-administered medications on file prior to visit.     No Known Allergies   Review of Systems Constitutional: negative for chills, fatigue, fevers and sweats Eyes: negative for irritation, redness and visual disturbance Ears, nose, mouth, throat, and face: negative for hearing loss, nasal congestion, snoring and tinnitus Respiratory: negative for asthma, cough, sputum Cardiovascular: negative for chest pain, dyspnea, exertional chest pressure/discomfort, irregular heart beat, palpitations and syncope Gastrointestinal: negative for abdominal pain, change in bowel habits, nausea and vomiting Genitourinary: negative for abnormal menstrual periods, genital lesions, sexual problems and vaginal discharge, dysuria and urinary incontinence Integument/breast:  negative for breast lump, breast tenderness and nipple discharge Hematologic/lymphatic: negative for bleeding and easy bruising Musculoskeletal:negative for back pain and muscle weakness Neurological: negative for dizziness, headaches, vertigo and weakness Endocrine: negative for diabetic symptoms including polydipsia, polyuria and skin dryness Allergic/Immunologic: negative for hay fever and urticaria        Objective:  Blood pressure 115/76, pulse 66, height 5\' 7"  (1.702 m), weight 149 lb 6.4 oz (67.8 kg), last menstrual period 06/06/2018. Body mass index is 23.4 kg/m.  General Appearance:    Alert, cooperative, no distress, appears stated age  Head:    Normocephalic, without  obvious abnormality, atraumatic  Eyes:    PERRL, conjunctiva/corneas clear, EOM's intact, both eyes  Ears:    Normal external ear canals, both ears  Nose:   Nares normal, septum midline, mucosa normal, no drainage or sinus tenderness  Throat:   Lips, mucosa, and tongue normal; teeth and gums normal  Neck:   Supple, symmetrical, trachea midline, no adenopathy; thyroid: no enlargement/tenderness/nodules; no carotid bruit or JVD  Back:     Symmetric, no curvature, ROM normal, no CVA tenderness  Lungs:     Clear to auscultation bilaterally, respirations unlabored  Chest Wall:    No tenderness or deformity   Heart:    Regular rate and rhythm, S1 and S2 normal, no murmur, rub or gallop  Breast Exam:    No tenderness, masses, or nipple abnormality  Abdomen:     Soft, non-tender, bowel sounds active all four quadrants, no masses, no organomegaly.    Genitalia:    Pelvic:external genitalia normal, vagina without lesions, discharge, or tenderness, rectovaginal septum  normal. Cervix with post-LEEP changes, otherwise normal, no cervical motion tenderness, no adnexal masses or tenderness.  No IUD strings visible. Uterus normal size, shape, mobile, regular contours, nontender.  Rectal:    Normal external sphincter.  No  hemorrhoids appreciated. Internal exam not done.   Extremities:   Extremities normal, atraumatic, no cyanosis or edema  Pulses:   2+ and symmetric all extremities  Skin:   Skin color, texture, turgor normal, no rashes or lesions  Lymph nodes:   Cervical, supraclavicular, and axillary nodes normal  Neurologic:   CNII-XII intact, normal strength, sensation and reflexes throughout   .  Labs:  Lab Results  Component Value Date   WBC 6.6 09/22/2017   HGB 13.6 09/22/2017   HCT 39.1 09/22/2017   MCV 89.9 09/22/2017   PLT 187 09/22/2017    Lab Results  Component Value Date   CREATININE 0.80 06/20/2017   BUN 15 06/20/2017   NA 141 06/20/2017   K 4.2 06/20/2017   CL 103 06/20/2017   CO2 25 06/20/2017    Lab Results  Component Value Date   ALT 17 06/20/2017   AST 23 06/20/2017   ALKPHOS 65 06/20/2017   BILITOT 0.6 06/20/2017    Lab Results  Component Value Date   TSH 1.62 11/16/2014    Lab Results  Component Value Date   CHOL 184 11/16/2014   HDL 80 (A) 11/16/2014   LDLCALC 87 11/16/2014   TRIG 86 11/16/2014     Assessment:   Healthy female exam.  H/o cervical dysplasia with LEEP H/o breast lumpectomy  Plan:     Blood tests: CBC with diff, Comprehensive metabolic panel, TSH, lipid panel. Breast self exam technique reviewed and patient encouraged to perform self-exam monthly. Contraception: rhythm method Discussed healthy lifestyle modifications. Pap smear performed today for h/o abnormal pap smears. If normal repeat next year, can resume q 3 year screenings.   H/o breast lumpectomy (benign findings). To repeat mammogram in 1 year.  Follow up in 1 year.    Rubie Maid, MD [facility]

## 2018-06-26 NOTE — Progress Notes (Signed)
Pt stated that she is doing well no complaints.  

## 2018-06-27 LAB — COMPREHENSIVE METABOLIC PANEL
ALT: 19 IU/L (ref 0–32)
AST: 19 IU/L (ref 0–40)
Albumin/Globulin Ratio: 2.5 — ABNORMAL HIGH (ref 1.2–2.2)
Albumin: 4.5 g/dL (ref 3.5–5.5)
Alkaline Phosphatase: 60 IU/L (ref 39–117)
BILIRUBIN TOTAL: 0.4 mg/dL (ref 0.0–1.2)
BUN/Creatinine Ratio: 16 (ref 9–23)
BUN: 16 mg/dL (ref 6–24)
CHLORIDE: 102 mmol/L (ref 96–106)
CO2: 25 mmol/L (ref 20–29)
Calcium: 9.3 mg/dL (ref 8.7–10.2)
Creatinine, Ser: 0.98 mg/dL (ref 0.57–1.00)
GFR calc Af Amer: 81 mL/min/{1.73_m2} (ref >59)
GFR calc non Af Amer: 70 mL/min/{1.73_m2} (ref >59)
GLUCOSE: 89 mg/dL (ref 65–99)
Globulin, Total: 1.8 g/dL (ref 1.5–4.5)
Potassium: 4.2 mmol/L (ref 3.5–5.2)
Sodium: 142 mmol/L (ref 134–144)
TOTAL PROTEIN: 6.3 g/dL (ref 6.0–8.5)

## 2018-06-27 LAB — TSH: TSH: 1.95 u[IU]/mL (ref 0.450–4.500)

## 2018-06-27 LAB — LIPID PANEL
CHOLESTEROL TOTAL: 196 mg/dL (ref 100–199)
Chol/HDL Ratio: 2.2 ratio (ref 0.0–4.4)
HDL: 90 mg/dL (ref >39)
LDL Calculated: 89 mg/dL (ref 0–99)
TRIGLYCERIDES: 87 mg/dL (ref 0–149)
VLDL Cholesterol Cal: 17 mg/dL (ref 5–40)

## 2018-06-27 LAB — CBC
Hematocrit: 42.1 % (ref 34.0–46.6)
Hemoglobin: 14.2 g/dL (ref 11.1–15.9)
MCH: 31.1 pg (ref 26.6–33.0)
MCHC: 33.7 g/dL (ref 31.5–35.7)
MCV: 92 fL (ref 79–97)
PLATELETS: 223 10*3/uL (ref 150–450)
RBC: 4.56 x10E6/uL (ref 3.77–5.28)
RDW: 12.9 % (ref 12.3–15.4)
WBC: 6.7 10*3/uL (ref 3.4–10.8)

## 2018-06-27 LAB — VITAMIN D 25 HYDROXY (VIT D DEFICIENCY, FRACTURES): Vit D, 25-Hydroxy: 27.5 ng/mL — ABNORMAL LOW (ref 30.0–100.0)

## 2018-06-28 LAB — CYTOLOGY - PAP
DIAGNOSIS: NEGATIVE
HPV (WINDOPATH): NOT DETECTED

## 2018-06-30 IMAGING — US US BREAST*L* LIMITED INC AXILLA
1 series · 6 of 6 positions shown · non-contrast
Comparison: Previous exam(s).

CLINICAL DATA: Screening recall for a left breast asymmetry.

EXAM:
DIGITAL DIAGNOSTIC LEFT MAMMOGRAM WITH CAD AND TOMO
ULTRASOUND LEFT BREAST

[Series 1: us breast*left* limited inc axilla · 0.06mm/px · 6 of 6 slices shown]
[im 1/6]
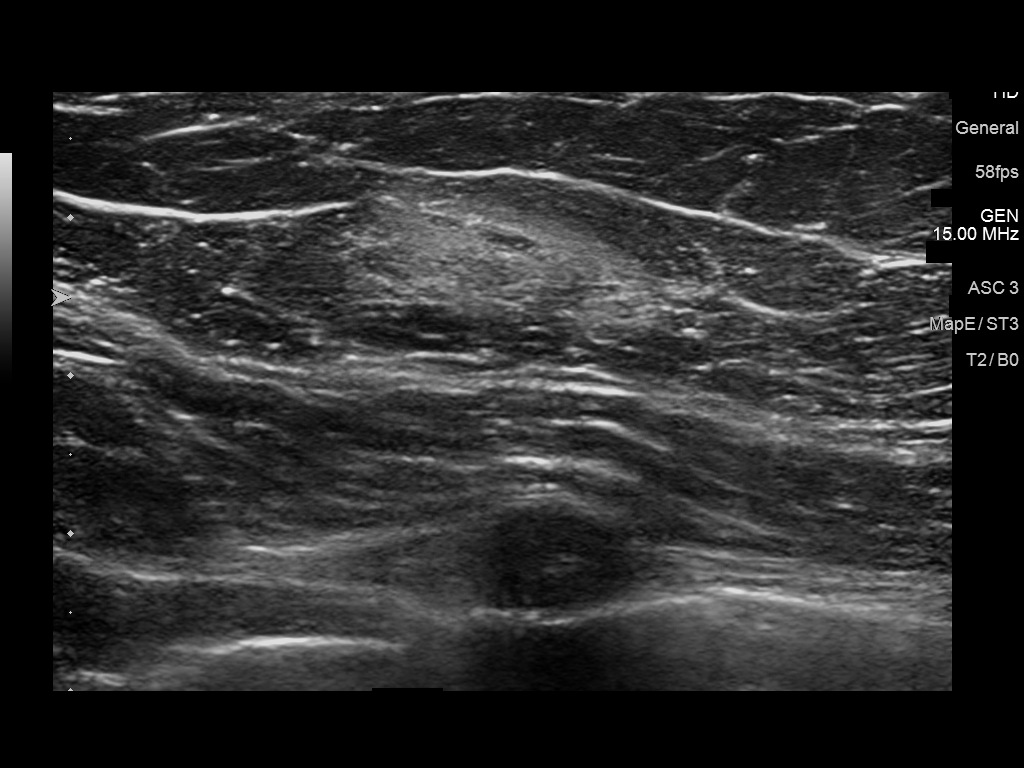
[im 2/6]
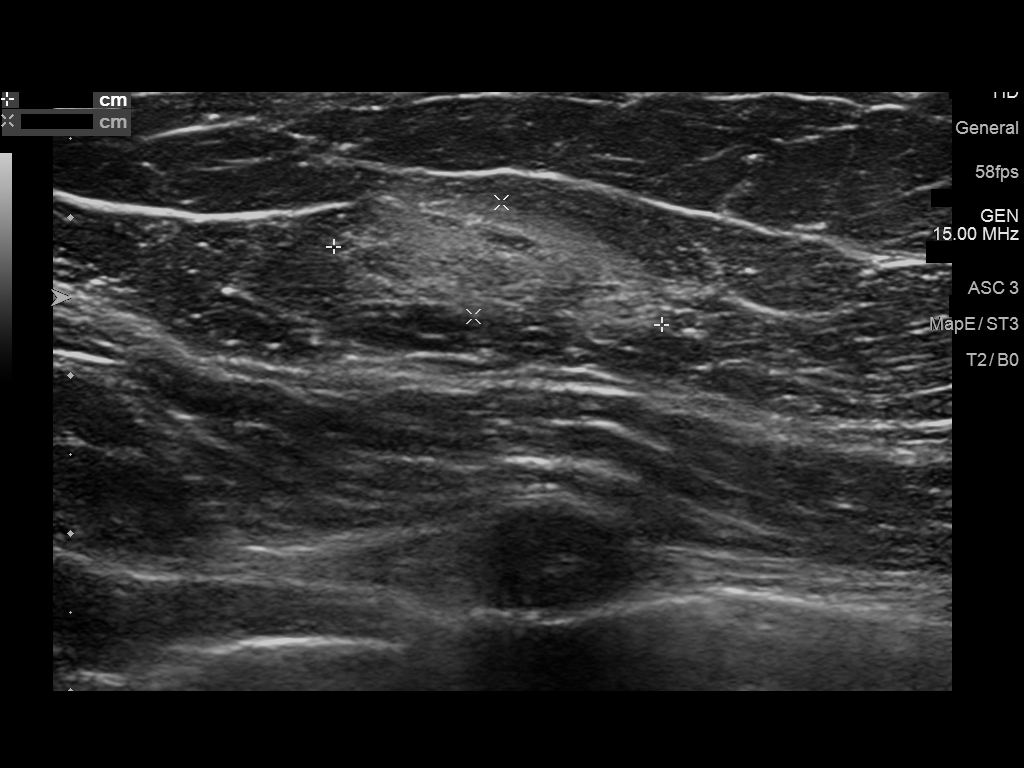
[im 3/6]
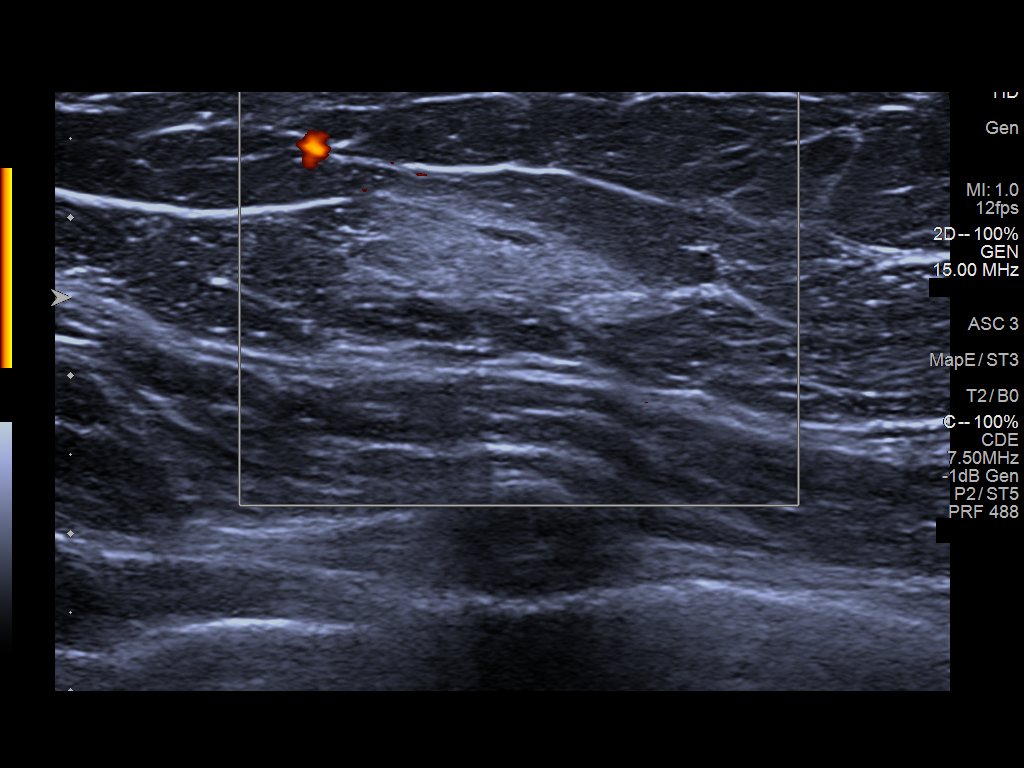
[im 4/6]
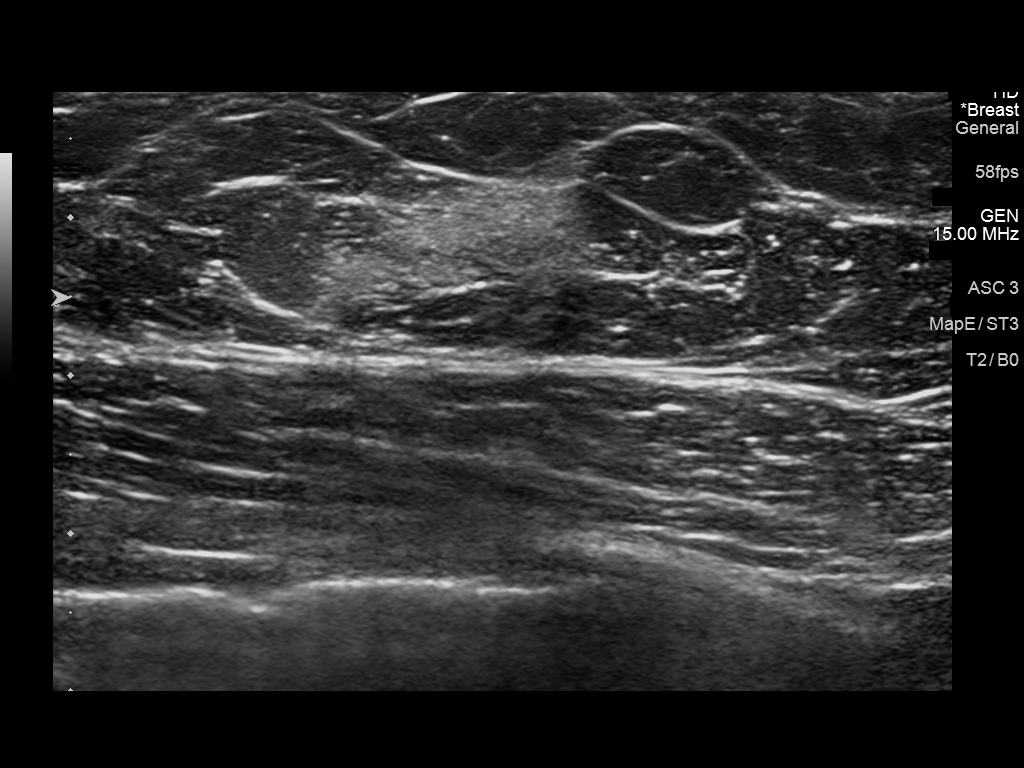
[im 5/6]
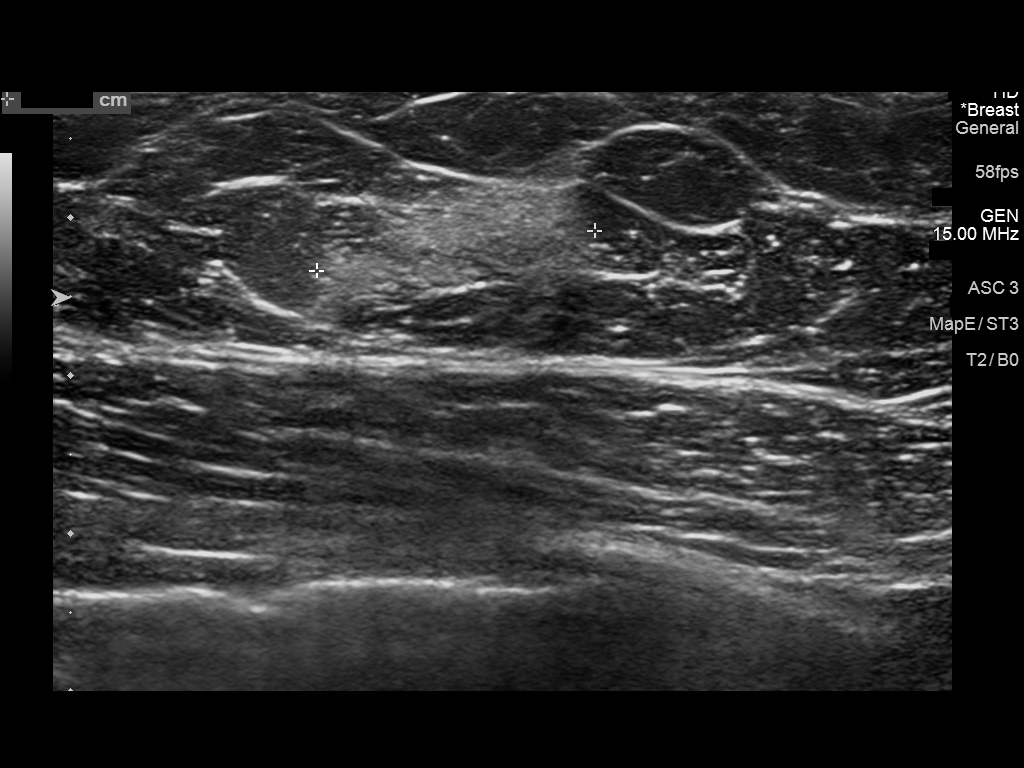
[im 6/6]
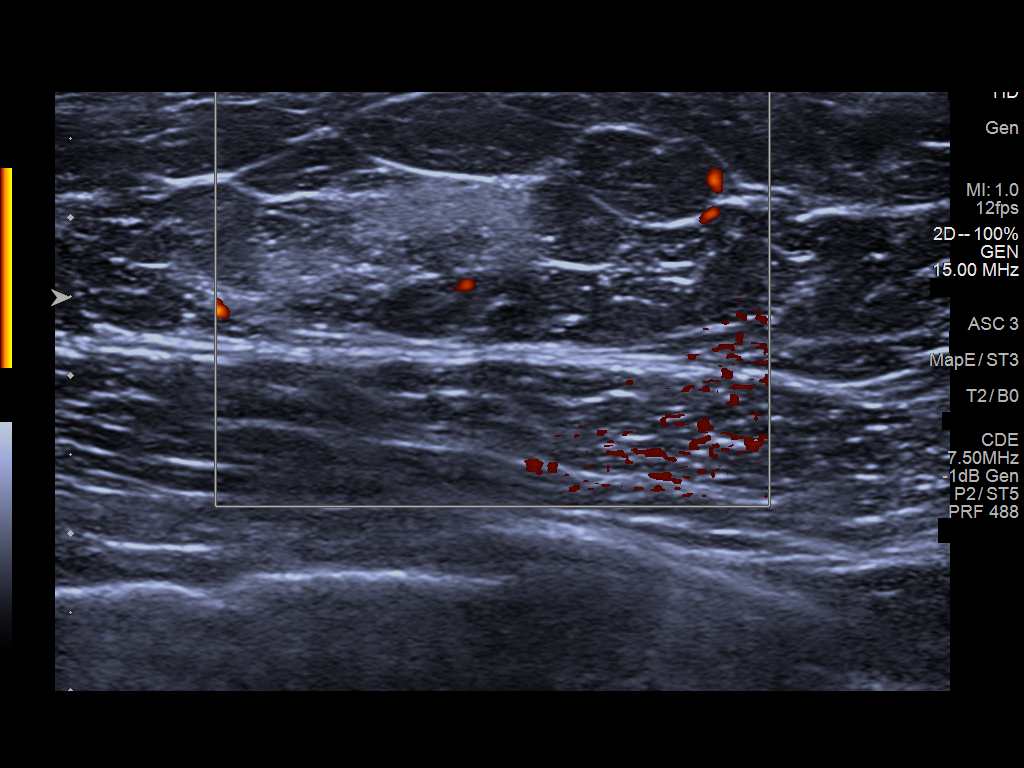

[6 of 6 positions shown; findings below may reference images not displayed]

ACR Breast Density Category c: The breast tissue is heterogeneously
dense, which may obscure small masses.
FINDINGS: There is a persistent asymmetry in the upper inner quadrant of the
left breast spanning approximately 1.5 cm. There is fat interspersed
within this area suggesting that this is benign. A subtle asymmetry
was seen on the prior mammogram in 4827 in this location, however is
more prominent on the 5086 screening mammogram.

Mammographic images were processed with CAD.

Ultrasound of the left breast at [DATE], 9 cm from the nipple
demonstrates an echogenic irregular mass measuring

2.1 x 0.7 x 1.8 cm. No blood flow is seen within the mass on color
Doppler imaging.
IMPRESSION: There is a mass in the upper inner quadrant of the left breast,
which overall has benign features, however is more prominent than on
the prior exam. Therefore, biopsy is warranted.

RECOMMENDATION:
Ultrasound guided biopsy is recommended for the left breast mass at

I have discussed the findings and recommendations with the patient.
Results were also provided in writing at the conclusion of the
visit. If applicable, a reminder letter will be sent to the patient
regarding the next appointment.

BI-RADS CATEGORY  4: Suspicious.

## 2019-03-13 ENCOUNTER — Other Ambulatory Visit: Payer: Self-pay | Admitting: General Surgery

## 2019-03-13 DIAGNOSIS — R928 Other abnormal and inconclusive findings on diagnostic imaging of breast: Secondary | ICD-10-CM

## 2019-03-14 ENCOUNTER — Other Ambulatory Visit: Payer: Self-pay | Admitting: General Surgery

## 2019-03-14 DIAGNOSIS — Z1231 Encounter for screening mammogram for malignant neoplasm of breast: Secondary | ICD-10-CM

## 2019-05-15 ENCOUNTER — Other Ambulatory Visit: Payer: BC Managed Care – PPO

## 2019-05-24 ENCOUNTER — Ambulatory Visit
Admission: RE | Admit: 2019-05-24 | Discharge: 2019-05-24 | Disposition: A | Discharge status: Home / Self Care | Payer: BC Managed Care – PPO | Source: Ambulatory Visit | Attending: General Surgery | Admitting: General Surgery

## 2019-05-24 ENCOUNTER — Other Ambulatory Visit: Payer: Self-pay

## 2019-05-24 DIAGNOSIS — Z1231 Encounter for screening mammogram for malignant neoplasm of breast: Secondary | ICD-10-CM | POA: Diagnosis present

## 2019-07-01 NOTE — Progress Notes (Signed)
Pt is present for annual exam.  Pt requested a refill of xanax and stated that she is getting tired by 12 noon.

## 2019-07-01 NOTE — Patient Instructions (Addendum)
Preventive Care 40-47 Years Old, Female Preventive care refers to visits with your health care provider and lifestyle choices that can promote health and wellness. This includes:  A yearly physical exam. This may also be called an annual well check.  Regular dental visits and eye exams.  Immunizations.  Screening for certain conditions.  Healthy lifestyle choices, such as eating a healthy diet, getting regular exercise, not using drugs or products that contain nicotine and tobacco, and limiting alcohol use. What can I expect for my preventive care visit? Physical exam Your health care provider will check your:  Height and weight. This may be used to calculate body mass index (BMI), which tells if you are at a healthy weight.  Heart rate and blood pressure.  Skin for abnormal spots. Counseling Your health care provider may ask you questions about your:  Alcohol, tobacco, and drug use.  Emotional well-being.  Home and relationship well-being.  Sexual activity.  Eating habits.  Work and work environment.  Method of birth control.  Menstrual cycle.  Pregnancy history. What immunizations do I need?  Influenza (flu) vaccine  This is recommended every year. Tetanus, diphtheria, and pertussis (Tdap) vaccine  You may need a Td booster every 10 years. Varicella (chickenpox) vaccine  You may need this if you have not been vaccinated. Zoster (shingles) vaccine  You may need this after age 60. Measles, mumps, and rubella (MMR) vaccine  You may need at least one dose of MMR if you were born in 1957 or later. You may also need a second dose. Pneumococcal conjugate (PCV13) vaccine  You may need this if you have certain conditions and were not previously vaccinated. Pneumococcal polysaccharide (PPSV23) vaccine  You may need one or two doses if you smoke cigarettes or if you have certain conditions. Meningococcal conjugate (MenACWY) vaccine  You may need this if you  have certain conditions. Hepatitis A vaccine  You may need this if you have certain conditions or if you travel or work in places where you may be exposed to hepatitis A. Hepatitis B vaccine  You may need this if you have certain conditions or if you travel or work in places where you may be exposed to hepatitis B. Haemophilus influenzae type b (Hib) vaccine  You may need this if you have certain conditions. Human papillomavirus (HPV) vaccine  If recommended by your health care provider, you may need three doses over 6 months. You may receive vaccines as individual doses or as more than one vaccine together in one shot (combination vaccines). Talk with your health care provider about the risks and benefits of combination vaccines. What tests do I need? Blood tests  Lipid and cholesterol levels. These may be checked every 5 years, or more frequently if you are over 50 years old.  Hepatitis C test.  Hepatitis B test. Screening  Lung cancer screening. You may have this screening every year starting at age 55 if you have a 30-pack-year history of smoking and currently smoke or have quit within the past 15 years.  Colorectal cancer screening. All adults should have this screening starting at age 50 and continuing until age 75. Your health care provider may recommend screening at age 45 if you are at increased risk. You will have tests every 1-10 years, depending on your results and the type of screening test.  Diabetes screening. This is done by checking your blood sugar (glucose) after you have not eaten for a while (fasting). You may have this   done every 1-3 years.  Mammogram. This may be done every 1-2 years. Talk with your health care provider about when you should start having regular mammograms. This may depend on whether you have a family history of breast cancer.  BRCA-related cancer screening. This may be done if you have a family history of breast, ovarian, tubal, or peritoneal  cancers.  Pelvic exam and Pap test. This may be done every 3 years starting at age 23. Starting at age 86, this may be done every 5 years if you have a Pap test in combination with an HPV test. Other tests  Sexually transmitted disease (STD) testing.  Bone density scan. This is done to screen for osteoporosis. You may have this scan if you are at high risk for osteoporosis. Follow these instructions at home: Eating and drinking  Eat a diet that includes fresh fruits and vegetables, whole grains, lean protein, and low-fat dairy.  Take vitamin and mineral supplements as recommended by your health care provider.  Do not drink alcohol if: ? Your health care provider tells you not to drink. ? You are pregnant, may be pregnant, or are planning to become pregnant.  If you drink alcohol: ? Limit how much you have to 0-1 drink a day. ? Be aware of how much alcohol is in your drink. In the [place] one drink equals one 12 oz bottle of beer (355 mL), one 5 oz glass of wine (148 mL), or one 1 oz glass of hard liquor (44 mL). Lifestyle  Take daily care of your teeth and gums.  Stay active. Exercise for at least 30 minutes on 5 or more days each week.  Do not use any products that contain nicotine or tobacco, such as cigarettes, e-cigarettes, and chewing tobacco. If you need help quitting, ask your health care provider.  If you are sexually active, practice safe sex. Use a condom or other form of birth control (contraception) in order to prevent pregnancy and STIs (sexually transmitted infections).  If told by your health care provider, take low-dose aspirin daily starting at age 27. What's next?  Visit your health care provider once a year for a well check visit.  Ask your health care provider how often you should have your eyes and teeth checked.  Stay up to date on all vaccines. This information is not intended to replace advice given to you by your health care provider. Make sure you  discuss any questions you have with your health care provider. Document Released: 12/18/2015 Document Revised: 08/02/2018 Document Reviewed: 08/02/2018 Elsevier Patient Education  2020 [place] self-awareness is knowing how your breasts look and feel. Doing breast self-awareness is important. It allows you to catch a breast problem early while it is still small and can be treated. All women should do breast self-awareness, including women who have had breast implants. Tell your doctor if you notice a change in your breasts. What you need:  A mirror.  A well-lit room. How to do a breast self-exam A breast self-exam is one way to learn what is normal for your breasts and to check for changes. To do a breast self-exam: Look for changes  1. Take off all the clothes above your waist. 2. Stand in front of a mirror in a room with good lighting. 3. Put your hands on your hips. 4. Push your hands down. 5. Look at your breasts and nipples in the mirror to see if one breast or nipple looks  different from the other. Check to see if: ? The shape of one breast is different. ? The size of one breast is different. ? There are wrinkles, dips, and bumps in one breast and not the other. 6. Look at each breast for changes in the skin, such as: ? Redness. ? Scaly areas. 7. Look for changes in your nipples, such as: ? Liquid around the nipples. ? Bleeding. ? Dimpling. ? Redness. ? A change in where the nipples are. Feel for changes  1. Lie on your back on the floor. 2. Feel each breast. To do this, follow these steps: ? Pick a breast to feel. ? Put the arm closest to that breast above your head. ? Use your other arm to feel the nipple area of your breast. Feel the area with the pads of your three middle fingers by making small circles with your fingers. For the first circle, press lightly. For the second circle, press harder. For the third circle, press even harder.  ? Keep making circles with your fingers at the different pressures as you move down your breast. Stop when you feel your ribs. ? Move your fingers a little toward the center of your body. ? Start making circles with your fingers again, this time going up until you reach your collarbone. ? Keep making up-and-down circles until you reach your armpit. Remember to keep using the three pressures. ? Feel the other breast in the same way. 3. Sit or stand in the tub or shower. 4. With soapy water on your skin, feel each breast the same way you did in step 2 when you were lying on the floor. Write down what you find Writing down what you find can help you remember what to tell your doctor. Write down:  What is normal for each breast.  Any changes you find in each breast, including: ? The kind of changes you find. ? Whether you have pain. ? Size and location of any lumps.  When you last had your menstrual period. General tips  Check your breasts every month.  If you are breastfeeding, the best time to check your breasts is after you feed your baby or after you use a breast pump.  If you get menstrual periods, the best time to check your breasts is 5-7 days after your menstrual period is over.  With time, you will become comfortable with the self-exam, and you will begin to know if there are changes in your breasts. Contact a doctor if you:  See a change in the shape or size of your breasts or nipples.  See a change in the skin of your breast or nipples, such as red or scaly skin.  Have fluid coming from your nipples that is not normal.  Find a lump or thick area that was not there before.  Have pain in your breasts.  Have any concerns about your breast health. Summary  Breast self-awareness includes looking for changes in your breasts, as well as feeling for changes within your breasts.  Breast self-awareness should be done in front of a mirror in a well-lit room.  You should  check your breasts every month. If you get menstrual periods, the best time to check your breasts is 5-7 days after your menstrual period is over.  Let your doctor know of any changes you see in your breasts, including changes in size, changes on the skin, pain or tenderness, or fluid from your nipples that is not normal. This  information is not intended to replace advice given to you by your health care provider. Make sure you discuss any questions you have with your health care provider. Document Released: 05/09/2008 Document Revised: 07/10/2018 Document Reviewed: 07/10/2018 Elsevier Patient Education  [place].   Fatigue If you have fatigue, you feel tired all the time and have a lack of energy or a lack of motivation. Fatigue may make it difficult to start or complete tasks because of exhaustion. In general, occasional or mild fatigue is often a normal response to activity or life. However, long-lasting (chronic) or extreme fatigue may be a symptom of a medical condition. Follow these instructions at home: General instructions  Watch your fatigue for any changes.  Go to bed and get up at the same time every day.  Avoid fatigue by pacing yourself during the day and getting enough sleep at night.  Maintain a healthy weight. Medicines  Take over-the-counter and prescription medicines only as told by your health care provider.  Take a multivitamin, if told by your health care provider.  Do not use herbal or dietary supplements unless they are approved by your health care provider. Activity   Exercise regularly, as told by your health care provider.  Use or practice techniques to help you relax, such as yoga, tai chi, meditation, or massage therapy. Eating and drinking   Avoid heavy meals in the evening.  Eat a well-balanced diet, which includes lean proteins, whole grains, plenty of fruits and vegetables, and low-fat dairy products.  Avoid consuming too much  caffeine.  Avoid the use of alcohol.  Drink enough fluid to keep your urine pale yellow. Lifestyle  Change situations that cause you stress. Try to keep your work and personal schedule in balance.  Do not use any products that contain nicotine or tobacco, such as cigarettes and e-cigarettes. If you need help quitting, ask your health care provider.  Do not use drugs. Contact a health care provider if:  Your fatigue does not get better.  You have a fever.  You suddenly lose or gain weight.  You have headaches.  You have trouble falling asleep or sleeping through the night.  You feel angry, guilty, anxious, or sad.  You are unable to have a bowel movement (constipation).  Your skin is dry.  You have swelling in your legs or another part of your body. Get help right away if:  You feel confused.  Your vision is blurry.  You feel faint or you pass out.  You have a severe headache.  You have severe pain in your abdomen, your back, or the area between your waist and hips (pelvis).  You have chest pain, shortness of breath, or an irregular or fast heartbeat.  You are unable to urinate, or you urinate less than normal.  You have abnormal bleeding, such as bleeding from the rectum, vagina, nose, lungs, or nipples.  You vomit blood.  You have thoughts about hurting yourself or others. If you ever feel like you may hurt yourself or others, or have thoughts about taking your own life, get help right away. You can go to your nearest emergency department or call:  Your local emergency services (911 in the [place]  A suicide crisis helpline, such as the [facility] at 908-445-6744. This is open 24 hours a day. Summary  If you have fatigue, you feel tired all the time and have a lack of energy or a lack of motivation.  Fatigue may  make it difficult to start or complete tasks because of exhaustion.  Long-lasting (chronic) or extreme fatigue may  be a symptom of a medical condition.  Exercise regularly, as told by your health care provider.  Change situations that cause you stress. Try to keep your work and personal schedule in balance. This information is not intended to replace advice given to you by your health care provider. Make sure you discuss any questions you have with your health care provider. Document Released: 09/18/2007 Document Revised: 03/14/2019 Document Reviewed: 08/16/2017 Elsevier Patient Education  2020 [employer].

## 2019-07-02 ENCOUNTER — Ambulatory Visit (INDEPENDENT_AMBULATORY_CARE_PROVIDER_SITE_OTHER): Payer: BC Managed Care – PPO | Admitting: Obstetrics and Gynecology

## 2019-07-02 ENCOUNTER — Other Ambulatory Visit: Payer: Self-pay

## 2019-07-02 ENCOUNTER — Encounter: Payer: Self-pay | Admitting: Obstetrics and Gynecology

## 2019-07-02 ENCOUNTER — Other Ambulatory Visit (HOSPITAL_COMMUNITY)
Admission: RE | Admit: 2019-07-02 | Discharge: 2019-07-02 | Disposition: A | Discharge status: Home / Self Care | Payer: BC Managed Care – PPO | Source: Ambulatory Visit | Attending: Obstetrics and Gynecology | Admitting: Obstetrics and Gynecology

## 2019-07-02 VITALS — BP 122/71 | HR 116 | Ht 67.0 in | Wt 161.1 lb

## 2019-07-02 DIAGNOSIS — Z8742 Personal history of other diseases of the female genital tract: Secondary | ICD-10-CM | POA: Insufficient documentation

## 2019-07-02 DIAGNOSIS — Z13220 Encounter for screening for lipoid disorders: Secondary | ICD-10-CM | POA: Diagnosis not present

## 2019-07-02 DIAGNOSIS — Z01419 Encounter for gynecological examination (general) (routine) without abnormal findings: Secondary | ICD-10-CM | POA: Diagnosis not present

## 2019-07-02 DIAGNOSIS — R000 Tachycardia, unspecified: Secondary | ICD-10-CM

## 2019-07-02 DIAGNOSIS — Z8659 Personal history of other mental and behavioral disorders: Secondary | ICD-10-CM

## 2019-07-02 DIAGNOSIS — R5383 Other fatigue: Secondary | ICD-10-CM

## 2019-07-02 DIAGNOSIS — Z9889 Other specified postprocedural states: Secondary | ICD-10-CM | POA: Diagnosis not present

## 2019-07-02 DIAGNOSIS — Z1231 Encounter for screening mammogram for malignant neoplasm of breast: Secondary | ICD-10-CM

## 2019-07-02 MED ORDER — ALPRAZOLAM 0.5 MG PO TABS: 0.5000 mg | ORAL_TABLET | Freq: Every evening | ORAL | 3 refills | Status: DC | PRN

## 2019-07-02 MED ORDER — ALPRAZOLAM 0.5 MG PO TABS: 0.5000 mg | ORAL_TABLET | Freq: Every evening | ORAL | 3 refills | Status: AC | PRN

## 2019-07-02 NOTE — Progress Notes (Signed)
GYNECOLOGY ANNUAL PHYSICAL EXAM PROGRESS NOTE  Subjective:    Jasmine Hodges is a 48 y.o. G72P2002 female who presents for an annual exam. The patient is sexually active. The patient wears seatbelts: yes. She's started regular exercise and eating healthily last week- doing [employer], which comes with healthy diet plan. Has the patient ever been transfused or tattooed?: no. The patient reports that there is not domestic violence in her life.   PCP- Danelle Berry, NP .  Pt reports fatigue, getting tired by noon. She's felt progressively more tired over the last few months. She has barely enough energy to get through the day, but is exhausted at the end of the day. She has had to take a Vitamin D supplement in the past, but it's been 6 months. Denies new anxiety/sadness. Eating well and exercising regularly.  For insomnia, needs refill on Xanax. Still has about 5 left. Sleeping better with her exercise and diet changes.    Gynecologic History Menarche age: 82 Patient's last menstrual period was 06/13/2019. Normal and regular menstrual cycles, moderate flow- heavier than they used to be. Notes few hot flashes at night, but none lately.  Contraception: rhythm method.  History of STI's: Denies Last Pap: 06/26/2018. Results were: normal. H/o LGSIL pap followed by normal colposcopy and negative biopsies in 2017.  Notes h/o abnormal pap smears in the past. Has h/o LEEP. Last mammogram: 05/24/2019, normal.  (04/10/2018- results were: abnormal, mass in upper inner quadrant of right breast. Followed by right breast lumpectomy (benign))   OB History  Gravida Para Term Preterm AB Living  2 2 2  0 0 2  SAB TAB Ectopic Multiple Live Births  0 0 0 0 2    # Outcome Date GA Lbr Len/2nd Weight Sex Delivery Anes PTL Lv  2 Term 2002    F Vag-Spont   LIV  1 Term 1999    M Vag-Spont   LIV    Past Medical History:  Diagnosis Date  . Anxiety   . CIN II (cervical intraepithelial neoplasia II)    . Depression   . GERD (gastroesophageal reflux disease)    h/o  . History of abnormal cells from cervix   . History of kidney stones   . LGSIL (low grade squamous intraepithelial dysplasia)   . Reflux gastritis   . Sleep disturbance   . Vaginal Pap smear, abnormal   . Vitamin D deficiency     Past Surgical History:  Procedure Laterality Date  . BREAST BIOPSY Left 05/08/2018   path pending  . BREAST BIOPSY Left 05/31/2018   Procedure: BREAST BIOPSY WITH NEEDLE LOCALIZATION;  Surgeon: Herbert Pun, MD;  Location: [facility];  Service: General;  Laterality: Left;  . BREAST EXCISIONAL BIOPSY    . COLPOSCOPY W/ BIOPSY / CURETTAGE    . CYSTOSCOPY W/ RETROGRADES Bilateral 04/11/2016   Procedure: CYSTOSCOPY WITH RETROGRADE PYELOGRAM;  Surgeon: Hollice Espy, MD;  Location: [facility];  Service: Urology;  Laterality: Bilateral;  . CYSTOSCOPY/URETEROSCOPY/HOLMIUM LASER/STENT PLACEMENT Left 04/11/2016   Procedure: CYSTOSCOPY/URETEROSCOPY/HOLMIUM LASER/STENT PLACEMENT;  Surgeon: Hollice Espy, MD;  Location: [facility];  Service: Urology;  Laterality: Left;  . FOOT SURGERY Left 2010  . HYSTEROSCOPY N/A 09/22/2017   Procedure: HYSTEROSCOPY;  Surgeon: Rubie Maid, MD;  Location: [facility];  Service: Gynecology;  Laterality: N/A;  . IUD REMOVAL N/A 09/22/2017   Procedure: INTRAUTERINE DEVICE (IUD) REMOVAL;  Surgeon: Rubie Maid, MD;  Location: [facility];  Service: Gynecology;  Laterality: N/A;  . LEEP  02/2015  . STONE EXTRACTION WITH BASKET Left 04/11/2016   Procedure: STONE EXTRACTION WITH BASKET;  Surgeon: Hollice Espy, MD;  Location: [facility];  Service: Urology;  Laterality: Left;    Family History  Problem Relation Age of Onset  . Lung cancer Mother   . Hypertension Father   . Kidney Stones Father   . Diabetes Father   . Kidney disease Neg Hx   . Bladder Cancer Neg Hx   . Breast cancer Neg Hx     Social History   Socioeconomic History  . Marital status: Married    Spouse  name: Not on file  . Number of children: Not on file  . Years of education: Not on file  . Highest education level: Not on file  Occupational History  . Not on file  Social Needs  . Financial resource strain: Not on file  . Food insecurity    Worry: Not on file    Inability: Not on file  . Transportation needs    Medical: Not on file    Non-medical: Not on file  Tobacco Use  . Smoking status: Current Every Day Smoker    Packs/day: 0.50    Years: 20.00    Pack years: 10.00    Types: Cigarettes    Last attempt to quit: 01/30/2013    Years since quitting: 6.4  . Smokeless tobacco: Never Used  Substance and Sexual Activity  . Alcohol use: Yes    Alcohol/week: 3.0 standard drinks    Types: 3 Glasses of wine per week    Comment: occas wine  . Drug use: No  . Sexual activity: Yes    Birth control/protection: None  Lifestyle  . Physical activity    Days per week: Not on file    Minutes per session: Not on file  . Stress: Not on file  Relationships  . Social Herbalist on phone: Not on file    Gets together: Not on file    Attends religious service: Not on file    Active member of club or organization: Not on file    Attends meetings of clubs or organizations: Not on file    Relationship status: Not on file  . Intimate partner violence    Fear of current or ex partner: Not on file    Emotionally abused: Not on file    Physically abused: Not on file    Forced sexual activity: Not on file  Other Topics Concern  . Not on file  Social History Narrative  . Not on file    Current Outpatient Medications on File Prior to Visit  Medication Sig Dispense Refill  . ALPRAZolam (XANAX) 0.5 MG tablet Take 1 tablet (0.5 mg total) by mouth at bedtime as needed for anxiety. 30 tablet 3  . Melatonin 10 MG TABS Take 10 mg by mouth at bedtime.     . Turmeric 500 MG CAPS Take by mouth.     No current facility-administered medications on file prior to visit.     No Known  Allergies   Review of Systems Constitutional: negative for chills, fevers and sweats. Endorses fatigue for few months. Insomnia, unchanged. Eyes: negative for irritation, redness and visual disturbance Ears, nose, mouth, throat, and face: negative for hearing loss, nasal congestion, snoring and tinnitus Respiratory: negative for asthma, cough, sputum Cardiovascular: negative for chest pain, dyspnea, exertional chest pressure/discomfort, irregular heart beat, palpitations and syncope Gastrointestinal: negative  for abdominal pain, change in bowel habits, nausea and vomiting Genitourinary: negative for abnormal menstrual periods, genital lesions, sexual problems and vaginal discharge, dysuria and urinary incontinence Integument/breast: negative for breast lump, breast tenderness and nipple discharge Hematologic/lymphatic: negative for bleeding and easy bruising Musculoskeletal:negative for back pain and muscle weakness Neurological: negative for dizziness, headaches, vertigo and weakness Endocrine: negative for diabetic symptoms including polydipsia, polyuria and skin dryness Allergic/Immunologic: negative for hay fever and urticaria        Objective:  Blood pressure 122/71, pulse (!) 116, height 5\' 7"  (1.702 m), weight 73.1 kg, last menstrual period 06/13/2019. Body mass index is 25.23 kg/m.  General Appearance:    Alert, cooperative, no distress, appears stated age  HEENT:   Grossly normal Neck: No thyromegaly  Lungs:     Clear to auscultation bilaterally, respirations unlabored   Heart:    Regular rate and rhythm, S1 and S2 normal, no murmur, rub or gallop  Breast Exam:    No tenderness, masses, or nipple abnormality. Scar on upper left breast from lumpectomy  Abdomen:     Soft, non-tender, bowel sounds active all four quadrants, no masses, no organomegaly.    Genitalia:    Pelvic:external genitalia normal, vagina without lesions, discharge, or tenderness, rectovaginal septum  normal.  Cervix with post-LEEP changes, otherwise normal, no cervical motion tenderness, no adnexal masses or tenderness. Uterus normal size, shape, mobile, regular contours, nontender.  Rectal:    Normal external sphincter.  No hemorrhoids appreciated. Internal exam not done.   Extremities:   Extremities normal, atraumatic, no cyanosis or edema  Neurologic:   Grossly normal     Labs:  Lab Results  Component Value Date   WBC 6.7 06/26/2018   HGB 14.2 06/26/2018   HCT 42.1 06/26/2018   MCV 92 06/26/2018   PLT 223 06/26/2018    Lab Results  Component Value Date   CREATININE 0.98 06/26/2018   BUN 16 06/26/2018   NA 142 06/26/2018   K 4.2 06/26/2018   CL 102 06/26/2018   CO2 25 06/26/2018    Lab Results  Component Value Date   ALT 19 06/26/2018   AST 19 06/26/2018   ALKPHOS 60 06/26/2018   BILITOT 0.4 06/26/2018    Lab Results  Component Value Date   TSH 1.950 06/26/2018    Lab Results  Component Value Date   CHOL 196 06/26/2018   HDL 90 06/26/2018   LDLCALC 89 06/26/2018   TRIG 87 06/26/2018   CHOLHDL 2.2 06/26/2018     Assessment:   Problem List Items Addressed This Visit    None    Visit Diagnoses    Encounter for well woman exam with routine gynecological exam    -  Primary   Relevant Orders   Comprehensive metabolic panel   Lipid panel   CBC   Screening for lipid disorders       History of abnormal cervical Pap smear       Relevant Orders   Cytology - PAP   History of breast lump/mass excision       Relevant Orders   MM 3D SCREEN BREAST BILATERAL   Breast cancer screening by mammogram       Relevant Orders   MM 3D SCREEN BREAST BILATERAL   Fatigue, unspecified type       Relevant Orders   VITAMIN D 25 Hydroxy (Vit-D Deficiency, Fractures)   TSH   History of anxiety       Tachycardia  Plan:     Blood tests: TSH, vitamin D level, CBC, lipid panel, CMP Breast self exam technique reviewed and patient encouraged to perform self-exam  monthly. Contraception: rhythm method Discussed healthy lifestyle modifications. Praised healthy changes that pt has made thus far. Pap smear performed today for h/o abnormal pap smears. If normal, can resume q 3 year screenings.   For fatigue, will check a vitamin D level, thyroid panel. She denies new feelings of sadness or anxiety. PMH of vitamin D deficiency. FH of thyroid issues in mom, per pt. H/o breast lumpectomy (benign findings). Normal mammo 05/24/2019. Continue routine screening.  Tachycardia noted today, patient without symptoms.  Advised to monitor at home. If continues, may need further evaluation.   Follow up in 1 year or sooner if needed.   Marinus Maw  07/02/19 [facility]    I have seen and examined the patient with Marin Roberts, [place] PA-S.  I have reviewed the record and concur with patient management and plan.   Rubie Maid, MD [facility]

## 2019-07-04 ENCOUNTER — Other Ambulatory Visit: Payer: BC Managed Care – PPO

## 2019-07-04 ENCOUNTER — Other Ambulatory Visit: Payer: Self-pay

## 2019-07-04 DIAGNOSIS — Z13220 Encounter for screening for lipoid disorders: Secondary | ICD-10-CM

## 2019-07-04 DIAGNOSIS — R5383 Other fatigue: Secondary | ICD-10-CM

## 2019-07-04 DIAGNOSIS — E559 Vitamin D deficiency, unspecified: Secondary | ICD-10-CM

## 2019-07-04 DIAGNOSIS — Z01419 Encounter for gynecological examination (general) (routine) without abnormal findings: Secondary | ICD-10-CM

## 2019-07-05 LAB — COMPREHENSIVE METABOLIC PANEL
ALT: 19 IU/L (ref 0–32)
AST: 21 IU/L (ref 0–40)
Albumin/Globulin Ratio: 2.7 — ABNORMAL HIGH (ref 1.2–2.2)
Albumin: 4.8 g/dL (ref 3.8–4.8)
Alkaline Phosphatase: 63 IU/L (ref 39–117)
BUN/Creatinine Ratio: 15 (ref 9–23)
BUN: 13 mg/dL (ref 6–24)
Bilirubin Total: 0.7 mg/dL (ref 0.0–1.2)
CO2: 20 mmol/L (ref 20–29)
Calcium: 9.6 mg/dL (ref 8.7–10.2)
Chloride: 102 mmol/L (ref 96–106)
Creatinine, Ser: 0.87 mg/dL (ref 0.57–1.00)
GFR calc Af Amer: 92 mL/min/{1.73_m2} (ref >59)
GFR calc non Af Amer: 80 mL/min/{1.73_m2} (ref >59)
Globulin, Total: 1.8 g/dL (ref 1.5–4.5)
Glucose: 92 mg/dL (ref 65–99)
Potassium: 4.6 mmol/L (ref 3.5–5.2)
Sodium: 140 mmol/L (ref 134–144)
Total Protein: 6.6 g/dL (ref 6.0–8.5)

## 2019-07-05 LAB — LIPID PANEL
Chol/HDL Ratio: 2.5 ratio (ref 0.0–4.4)
Cholesterol, Total: 165 mg/dL (ref 100–199)
HDL: 67 mg/dL (ref >39)
LDL Calculated: 81 mg/dL (ref 0–99)
Triglycerides: 86 mg/dL (ref 0–149)
VLDL Cholesterol Cal: 17 mg/dL (ref 5–40)

## 2019-07-05 LAB — CBC
Hematocrit: 44 % (ref 34.0–46.6)
Hemoglobin: 14.7 g/dL (ref 11.1–15.9)
MCH: 30.7 pg (ref 26.6–33.0)
MCHC: 33.4 g/dL (ref 31.5–35.7)
MCV: 92 fL (ref 79–97)
Platelets: 234 10*3/uL (ref 150–450)
RBC: 4.79 x10E6/uL (ref 3.77–5.28)
RDW: 11.8 % (ref 11.7–15.4)
WBC: 5.7 10*3/uL (ref 3.4–10.8)

## 2019-07-05 LAB — TSH: TSH: 1.59 u[IU]/mL (ref 0.450–4.500)

## 2019-07-05 LAB — VITAMIN D 25 HYDROXY (VIT D DEFICIENCY, FRACTURES): Vit D, 25-Hydroxy: 30.9 ng/mL (ref 30.0–100.0)

## 2019-07-08 LAB — CYTOLOGY - PAP
Adequacy: ABSENT
Diagnosis: NEGATIVE

## 2019-08-08 ENCOUNTER — Other Ambulatory Visit: Payer: Self-pay

## 2019-08-08 DIAGNOSIS — Z20822 Contact with and (suspected) exposure to covid-19: Secondary | ICD-10-CM

## 2019-08-09 LAB — NOVEL CORONAVIRUS, NAA: SARS-CoV-2, NAA: NOT DETECTED

## 2019-09-21 IMAGING — MG MM DIGITAL DIAGNOSTIC UNILAT*L* W/ TOMO W/ CAD
6 series · 6 of 18 positions shown · non-contrast
Comparison: Previous exam(s).

CLINICAL DATA: Screening recall for a left breast asymmetry.

EXAM:
DIGITAL DIAGNOSTIC LEFT MAMMOGRAM WITH CAD AND TOMO
ULTRASOUND LEFT BREAST

[L ML synth-2D]
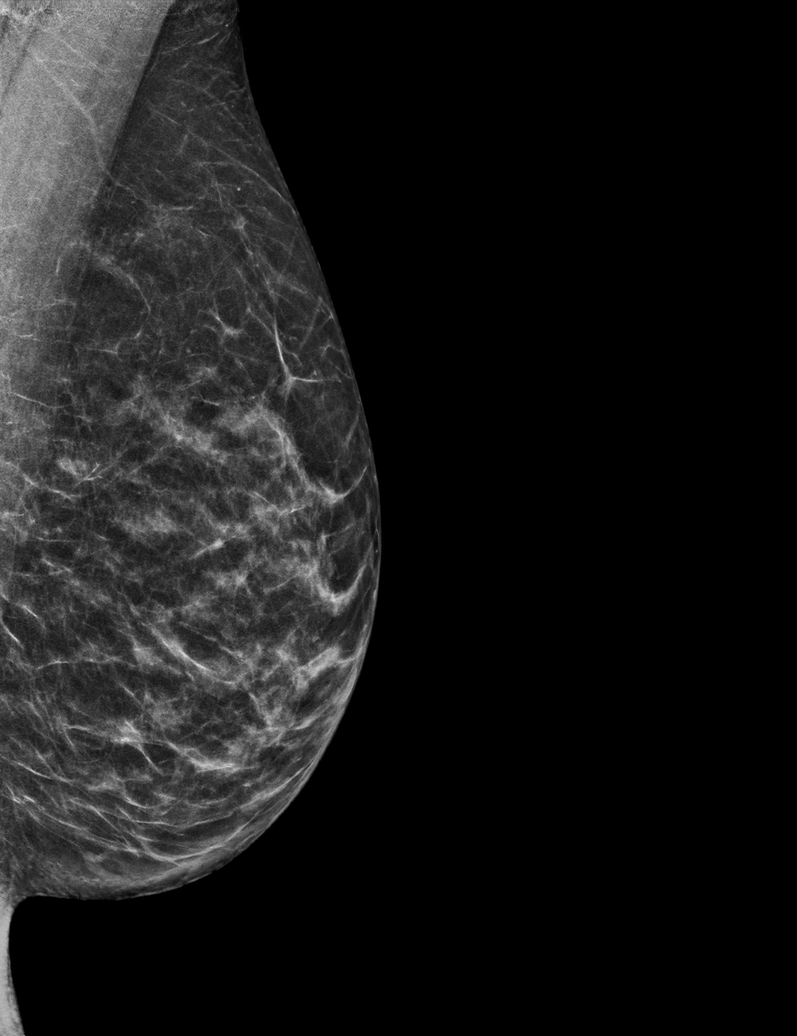

[L CC synth-2D]
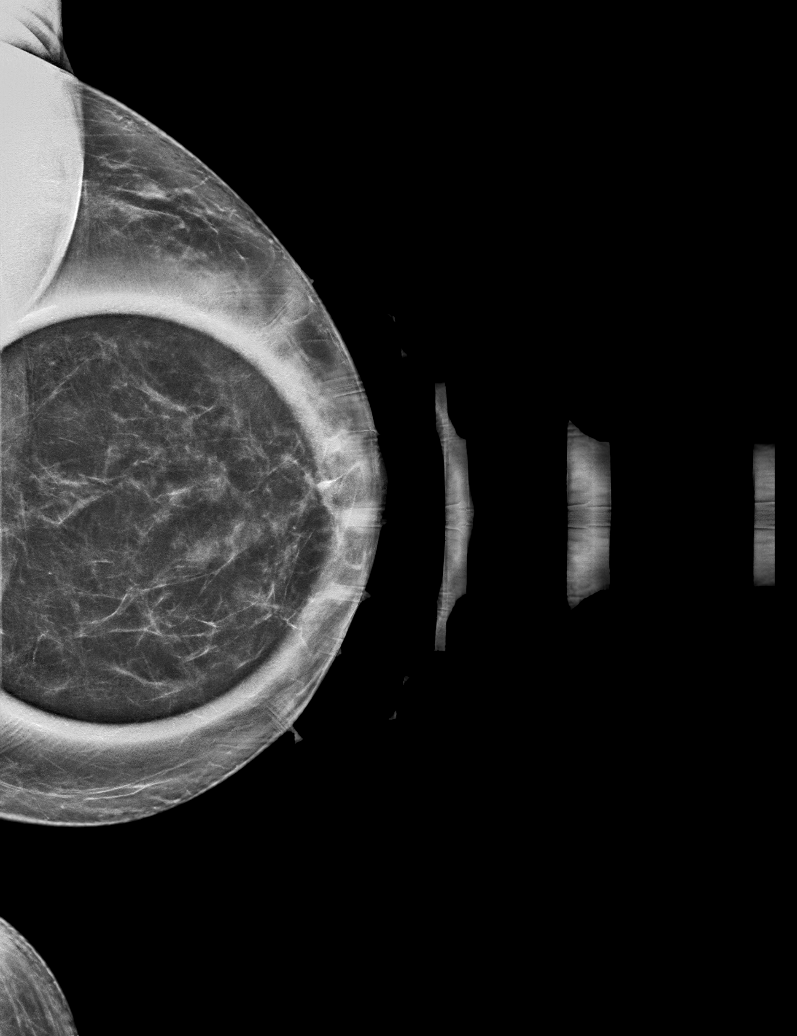

[L MLO synth-2D]
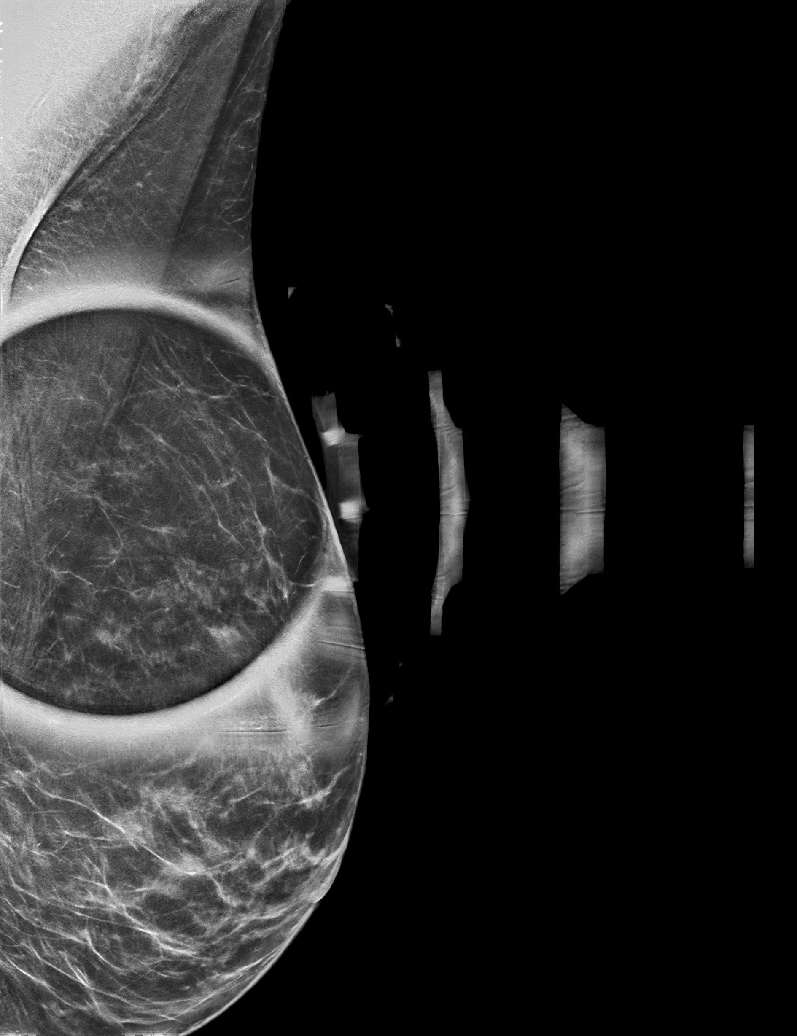

[L ML tomo · tomo slice 26/51.0]
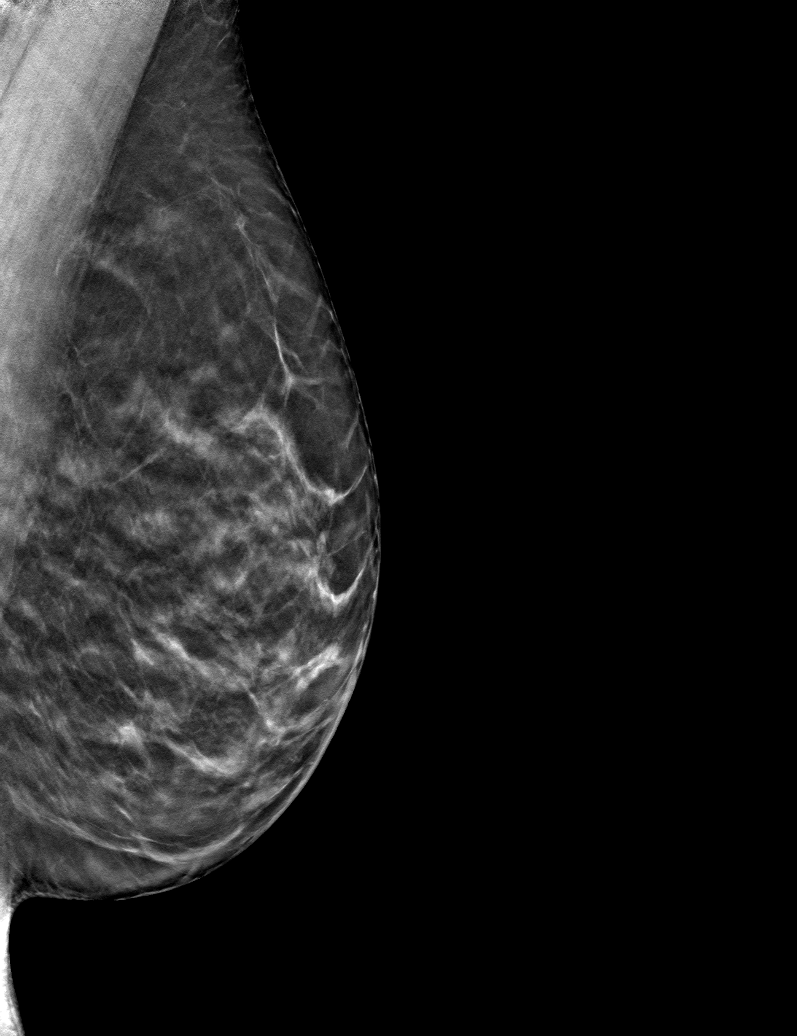

[L MLO tomo · tomo slice 30/59.0]
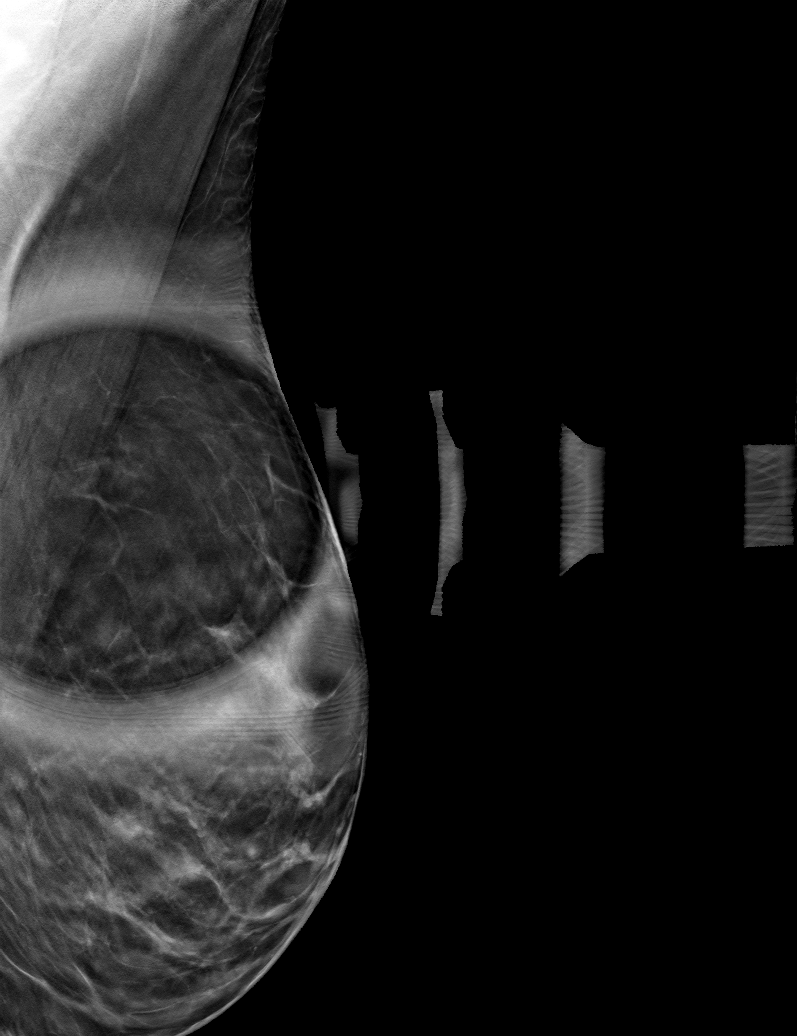

[L CC tomo · tomo slice 33/65.0]
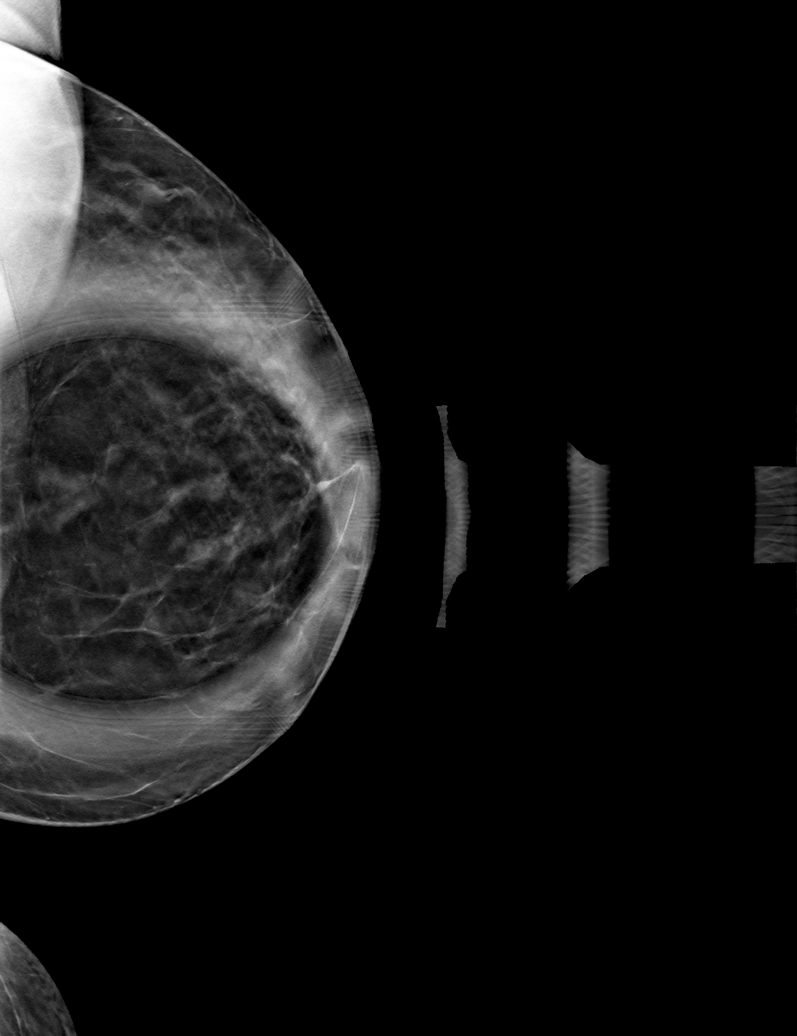

[6 of 18 positions shown; findings below may reference images not displayed]

ACR Breast Density Category c: The breast tissue is heterogeneously
dense, which may obscure small masses.
FINDINGS: There is a persistent asymmetry in the upper inner quadrant of the
left breast spanning approximately 1.5 cm. There is fat interspersed
within this area suggesting that this is benign. A subtle asymmetry
was seen on the prior mammogram in 4827 in this location, however is
more prominent on the 5086 screening mammogram.

Mammographic images were processed with CAD.

Ultrasound of the left breast at [DATE], 9 cm from the nipple
demonstrates an echogenic irregular mass measuring

2.1 x 0.7 x 1.8 cm. No blood flow is seen within the mass on color
Doppler imaging.
IMPRESSION: There is a mass in the upper inner quadrant of the left breast,
which overall has benign features, however is more prominent than on
the prior exam. Therefore, biopsy is warranted.

RECOMMENDATION:
Ultrasound guided biopsy is recommended for the left breast mass at

I have discussed the findings and recommendations with the patient.
Results were also provided in writing at the conclusion of the
visit. If applicable, a reminder letter will be sent to the patient
regarding the next appointment.

BI-RADS CATEGORY  4: Suspicious.

## 2019-11-27 ENCOUNTER — Ambulatory Visit: Payer: BC Managed Care – PPO | Attending: Internal Medicine

## 2019-11-27 DIAGNOSIS — Z20822 Contact with and (suspected) exposure to covid-19: Secondary | ICD-10-CM

## 2019-11-28 LAB — NOVEL CORONAVIRUS, NAA: SARS-CoV-2, NAA: NOT DETECTED

## 2020-02-02 ENCOUNTER — Ambulatory Visit: Payer: BC Managed Care – PPO | Attending: Internal Medicine

## 2020-02-02 DIAGNOSIS — Z23 Encounter for immunization: Secondary | ICD-10-CM | POA: Insufficient documentation

## 2020-02-02 NOTE — Progress Notes (Signed)
   Covid-19 Vaccination Clinic  Name:  Jasmine Hodges    MRN: [id number] DOB: 07/18/1971  02/02/2020  [name] was observed post Covid-19 immunization for 15 minutes without incidence. She was provided with Vaccine Information Sheet and instruction to access the V-Safe system.   [name] was instructed to call 911 with any severe reactions post vaccine: Marland Kitchen Difficulty breathing  . Swelling of your face and throat  . A fast heartbeat  . A bad rash all over your body  . Dizziness and weakness    Immunizations Administered    Name Date Dose VIS Date Route   Pfizer COVID-19 Vaccine 02/02/2020  1:20 PM 0.3 mL 11/15/2019 Intramuscular   Manufacturer: [place]   Lot: [id number]   [place]: [id number]

## 2020-02-26 ENCOUNTER — Ambulatory Visit: Payer: BC Managed Care – PPO | Attending: Internal Medicine

## 2020-02-26 DIAGNOSIS — Z23 Encounter for immunization: Secondary | ICD-10-CM

## 2020-02-26 NOTE — Progress Notes (Signed)
   Covid-19 Vaccination Clinic  Name:  Jasmine Hodges    MRN: [id number] DOB: Jan 05, 1971  02/26/2020  [name] was observed post Covid-19 immunization for 15 minutes without incident. She was provided with Vaccine Information Sheet and instruction to access the V-Safe system.   [name] was instructed to call 911 with any severe reactions post vaccine: Marland Kitchen Difficulty breathing  . Swelling of face and throat  . A fast heartbeat  . A bad rash all over body  . Dizziness and weakness   Immunizations Administered    Name Date Dose VIS Date Route   Pfizer COVID-19 Vaccine 02/26/2020  4:07 PM 0.3 mL 11/15/2019 Intramuscular   Manufacturer: [place]   Lot: [id number]   [place]: [id number]

## 2020-04-06 ENCOUNTER — Other Ambulatory Visit: Payer: Self-pay

## 2020-04-06 ENCOUNTER — Ambulatory Visit: Payer: BC Managed Care – PPO | Admitting: Dermatology

## 2020-04-06 DIAGNOSIS — L820 Inflamed seborrheic keratosis: Secondary | ICD-10-CM | POA: Diagnosis not present

## 2020-04-06 DIAGNOSIS — L578 Other skin changes due to chronic exposure to nonionizing radiation: Secondary | ICD-10-CM

## 2020-04-06 DIAGNOSIS — D2371 Other benign neoplasm of skin of right lower limb, including hip: Secondary | ICD-10-CM

## 2020-04-06 DIAGNOSIS — L821 Other seborrheic keratosis: Secondary | ICD-10-CM | POA: Diagnosis not present

## 2020-04-06 DIAGNOSIS — D239 Other benign neoplasm of skin, unspecified: Secondary | ICD-10-CM

## 2020-04-06 NOTE — Patient Instructions (Addendum)
Recommend daily broad spectrum sunscreen SPF 30+ to sun-exposed areas, reapply every 2 hours as needed. Call for new or changing lesions.  Cryotherapy Aftercare  . Wash gently with soap and water everyday.   . Apply Vaseline and Band-Aid daily until healed.  Seborrheic Keratosis  What causes seborrheic keratoses? Seborrheic keratoses are harmless, common skin growths that first appear during adult life.  As time goes by, more growths appear.  Some people may develop a large number of them.  Seborrheic keratoses appear on both covered and uncovered body parts.  They are not caused by sunlight.  The tendency to develop seborrheic keratoses can be inherited.  They vary in color from skin-colored to gray, brown, or even black.  They can be either smooth or have a rough, warty surface.   Seborrheic keratoses are superficial and look as if they were stuck on the skin.  Under the microscope this type of keratosis looks like layers upon layers of skin.  That is why at times the top layer may seem to fall off, but the rest of the growth remains and re-grows.    Treatment Seborrheic keratoses do not need to be treated, but can easily be removed in the office.  Seborrheic keratoses often cause symptoms when they rub on clothing or jewelry.  Lesions can be in the way of shaving.  If they become inflamed, they can cause itching, soreness, or burning.  Removal of a seborrheic keratosis can be accomplished by freezing, burning, or surgery. If any spot bleeds, scabs, or grows rapidly, please return to have it checked, as these can be an indication of a skin cancer.  

## 2020-04-06 NOTE — Progress Notes (Signed)
   Follow-Up Visit   Subjective  Jasmine Hodges is a 49 y.o. female who presents for the following: Skin Problem.  Patient has a spot on right leg that has been there for about 2 months and is not going away. It has been a little itchy with a little burn. No history of skin cancer. Patient also has a few moles on left abdomen and left thigh. They are flat and may have gotten larger. No other symptoms.   The following portions of the chart were reviewed this encounter and updated as appropriate:     Review of Systems:  No other skin or systemic complaints except as noted in HPI or Assessment and Plan.  Objective  Well appearing patient in no apparent distress; mood and affect are within normal limits.  A focused examination was performed including legs, abdomen, chest. Relevant physical exam findings are noted in the Assessment and Plan.  Objective  Right Posterior Hip: Firm brown papulenodule with dimple sign.   Objective  Right Lateral Upper Thigh: Erythematous keratotic waxy stuck-on papule  Objective  Left Abdomen: Stuck-on, waxy, tan-brown papule-- Discussed benign etiology and prognosis.    Assessment & Plan  Dermatofibroma Right Posterior Hip  Benign, observe.    Inflamed seborrheic keratosis Right Lateral Upper Thigh  Destruction of lesion - Right Lateral Upper Thigh  Destruction method: cryotherapy   Informed consent: discussed and consent obtained   Lesion destroyed using liquid nitrogen: Yes   Region frozen until ice ball extended beyond lesion: Yes   Outcome: patient tolerated procedure well with no complications   Post-procedure details: wound care instructions given    Seborrheic keratosis Left Abdomen  Benign, observe. Discussed cryotherapy if irritated   Actinic Damage - diffuse scaly erythematous macules with underlying dyspigmentation - Recommend daily broad spectrum sunscreen SPF 30+ to sun-exposed areas, reapply every 2 hours as needed.  -  Call for new or changing lesions.  Return if symptoms worsen or fail to improve.  Graciella Belton, RMA, am acting as scribe for Brendolyn Patty, MD .  Documentation: I have reviewed the above documentation for accuracy and completeness, and I agree with the above.  Brendolyn Patty, MD

## 2020-05-28 ENCOUNTER — Other Ambulatory Visit: Payer: Self-pay | Admitting: Nurse Practitioner

## 2020-05-28 DIAGNOSIS — Z1231 Encounter for screening mammogram for malignant neoplasm of breast: Secondary | ICD-10-CM

## 2020-06-12 ENCOUNTER — Ambulatory Visit
Admission: RE | Admit: 2020-06-12 | Discharge: 2020-06-12 | Disposition: A | Discharge status: Home / Self Care | Payer: BC Managed Care – PPO | Source: Ambulatory Visit | Attending: Nurse Practitioner | Admitting: Nurse Practitioner

## 2020-06-12 DIAGNOSIS — Z1231 Encounter for screening mammogram for malignant neoplasm of breast: Secondary | ICD-10-CM | POA: Diagnosis present

## 2020-07-02 ENCOUNTER — Encounter: Payer: BC Managed Care – PPO | Admitting: Obstetrics and Gynecology

## 2020-07-03 ENCOUNTER — Encounter: Payer: Self-pay | Admitting: Obstetrics and Gynecology

## 2020-07-03 ENCOUNTER — Ambulatory Visit (INDEPENDENT_AMBULATORY_CARE_PROVIDER_SITE_OTHER): Payer: BC Managed Care – PPO | Admitting: Obstetrics and Gynecology

## 2020-07-03 VITALS — BP 100/67 | HR 75 | Ht 67.0 in | Wt 153.3 lb

## 2020-07-03 DIAGNOSIS — E559 Vitamin D deficiency, unspecified: Secondary | ICD-10-CM | POA: Diagnosis not present

## 2020-07-03 DIAGNOSIS — Z9889 Other specified postprocedural states: Secondary | ICD-10-CM

## 2020-07-03 DIAGNOSIS — Z01419 Encounter for gynecological examination (general) (routine) without abnormal findings: Secondary | ICD-10-CM | POA: Diagnosis not present

## 2020-07-03 DIAGNOSIS — Z8742 Personal history of other diseases of the female genital tract: Secondary | ICD-10-CM | POA: Diagnosis not present

## 2020-07-03 DIAGNOSIS — E785 Hyperlipidemia, unspecified: Secondary | ICD-10-CM

## 2020-07-03 NOTE — Progress Notes (Signed)
GYNECOLOGY ANNUAL PHYSICAL EXAM PROGRESS NOTE  Subjective:    Jasmine Hodges is a 49 y.o. G42P2002 female who presents for an annual exam. She is overall doing well today and has no questions or concerns that she would like to address. She was recently started on Vitamin D3 and Crestor by her PCP due to lab findings. The patient is sexually active. The patient wears seatbelts: yes. She's started regular exercise and eating healthily last week- doing [employer], which comes with healthy diet plan. Has the patient ever been transfused or tattooed?: no. The patient reports that there is not domestic violence in her life.    Gynecologic History Menarche age: 14 Patient's last menstrual period was 06/26/2020. Normal and regular menstrual cycles, moderate/heavy flow (changing tampon q2h the first 3 days and then it typically lightens up)- heavier than they used to be.  Contraception: rhythm method.  History of STI's: Denies Last Pap: 07/02/2019. Results were: normal. H/o LGSIL pap followed by normal colposcopy and negative biopsies in 2017.  Notes h/o abnormal pap smears in the past. Has h/o LEEP. Last mammogram: 06/12/2020, normal.  (04/10/2018- results were: abnormal, mass in upper inner quadrant of right breast. Followed by right breast lumpectomy (benign))   OB History  Gravida Para Term Preterm AB Living  2 2 2  0 0 2  SAB TAB Ectopic Multiple Live Births  0 0 0 0 2    # Outcome Date GA Lbr Len/2nd Weight Sex Delivery Anes PTL Lv  2 Term 2002    F Vag-Spont   LIV  1 Term 1999    M Vag-Spont   LIV    Past Medical History:  Diagnosis Date  . Anxiety   . CIN II (cervical intraepithelial neoplasia II)   . Depression   . GERD (gastroesophageal reflux disease)    h/o  . History of abnormal cells from cervix   . History of kidney stones   . LGSIL (low grade squamous intraepithelial dysplasia)   . Reflux gastritis   . Sleep disturbance   . Vaginal Pap smear, abnormal   . Vitamin  D deficiency     Past Surgical History:  Procedure Laterality Date  . BREAST BIOPSY Left 05/08/2018   path pending  . BREAST BIOPSY Left 05/31/2018   Procedure: BREAST BIOPSY WITH NEEDLE LOCALIZATION;  Surgeon: Herbert Pun, MD;  Location: [facility];  Service: General;  Laterality: Left;  . BREAST EXCISIONAL BIOPSY    . COLPOSCOPY W/ BIOPSY / CURETTAGE    . CYSTOSCOPY W/ RETROGRADES Bilateral 04/11/2016   Procedure: CYSTOSCOPY WITH RETROGRADE PYELOGRAM;  Surgeon: Hollice Espy, MD;  Location: [facility];  Service: Urology;  Laterality: Bilateral;  . CYSTOSCOPY/URETEROSCOPY/HOLMIUM LASER/STENT PLACEMENT Left 04/11/2016   Procedure: CYSTOSCOPY/URETEROSCOPY/HOLMIUM LASER/STENT PLACEMENT;  Surgeon: Hollice Espy, MD;  Location: [facility];  Service: Urology;  Laterality: Left;  . FOOT SURGERY Left 2010  . HYSTEROSCOPY N/A 09/22/2017   Procedure: HYSTEROSCOPY;  Surgeon: Rubie Maid, MD;  Location: [facility];  Service: Gynecology;  Laterality: N/A;  . IUD REMOVAL N/A 09/22/2017   Procedure: INTRAUTERINE DEVICE (IUD) REMOVAL;  Surgeon: Rubie Maid, MD;  Location: [facility];  Service: Gynecology;  Laterality: N/A;  . LEEP  02/2015  . STONE EXTRACTION WITH BASKET Left 04/11/2016   Procedure: STONE EXTRACTION WITH BASKET;  Surgeon: Hollice Espy, MD;  Location: [facility];  Service: Urology;  Laterality: Left;    Family History  Problem Relation Age of Onset  . Lung cancer  Mother   . Hypertension Father   . Kidney Stones Father   . Diabetes Father   . Kidney disease Neg Hx   . Bladder Cancer Neg Hx   . Breast cancer Neg Hx     Social History   Socioeconomic History  . Marital status: Married    Spouse name: Not on file  . Number of children: Not on file  . Years of education: Not on file  . Highest education level: Not on file  Occupational History  . Not on file  Tobacco Use  . Smoking status: Current Every Day Smoker    Packs/day: 0.50    Years: 20.00    Pack years: 10.00     Types: Cigarettes    Last attempt to quit: 01/30/2013    Years since quitting: 7.4  . Smokeless tobacco: Never Used  Vaping Use  . Vaping Use: Never used  Substance and Sexual Activity  . Alcohol use: Yes    Alcohol/week: 3.0 standard drinks    Types: 3 Glasses of wine per week    Comment: occas wine  . Drug use: No  . Sexual activity: Yes    Birth control/protection: None  Other Topics Concern  . Not on file  Social History Narrative  . Not on file   Social Determinants of Health   Financial Resource Strain:   . Difficulty of Paying Living Expenses:   Food Insecurity:   . Worried About Charity fundraiser in the Last Year:   . Arboriculturist in the Last Year:   Transportation Needs:   . Film/video editor (Medical):   Marland Kitchen Lack of Transportation (Non-Medical):   Physical Activity:   . Days of Exercise per Week:   . Minutes of Exercise per Session:   Stress:   . Feeling of Stress :   Social Connections:   . Frequency of Communication with Friends and Family:   . Frequency of Social Gatherings with Friends and Family:   . Attends Religious Services:   . Active Member of Clubs or Organizations:   . Attends Archivist Meetings:   Marland Kitchen Marital Status:   Intimate Partner Violence:   . Fear of Current or Ex-Partner:   . Emotionally Abused:   Marland Kitchen Physically Abused:   . Sexually Abused:     Current Outpatient Medications on File Prior to Visit  Medication Sig Dispense Refill  . ALPRAZolam (XANAX) 0.5 MG tablet Take 1 tablet (0.5 mg total) by mouth at bedtime as needed for anxiety. 30 tablet 3  . Cholecalciferol (DIALYVITE VITAMIN D 5000) 125 MCG (5000 UT) capsule Take 5,000 Units by mouth daily.    . Melatonin 10 MG TABS Take 10 mg by mouth at bedtime.     . rosuvastatin (CRESTOR) 20 MG tablet Take 20 mg by mouth daily.     No current facility-administered medications on file prior to visit.    No Known Allergies   Review of Systems Constitutional: negative  for chills, fevers and sweats. Eyes: negative for irritation, redness and visual disturbance Ears, nose, mouth, throat, and face: negative for hearing loss, nasal congestion, snoring and tinnitus Respiratory: negative for asthma, cough, sputum Cardiovascular: negative for chest pain, dyspnea, exertional chest pressure/discomfort, irregular heart beat, palpitations and syncope Gastrointestinal: negative for abdominal pain, change in bowel habits, nausea and vomiting Genitourinary: negative for abnormal menstrual periods, genital lesions, sexual problems and vaginal discharge, dysuria and urinary incontinence Integument/breast: negative for breast lump, breast  tenderness and nipple discharge Hematologic/lymphatic: negative for bleeding and easy bruising Musculoskeletal:negative for back pain and muscle weakness Neurological: negative for dizziness, headaches, vertigo and weakness Endocrine: negative for diabetic symptoms including polydipsia, polyuria and skin dryness Allergic/Immunologic: negative for hay fever and urticaria      Objective:  Blood pressure 100/67, pulse 75, height 5\' 7"  (1.702 m), weight 69.5 kg, last menstrual period 06/26/2020. Body mass index is 24.01 kg/m.  General Appearance:    Alert, cooperative, no distress, appears stated age  HEENT:   Grossly normal Neck: No thyromegaly  Lungs:     Clear to auscultation bilaterally, respirations unlabored   Heart:    Regular rate and rhythm, S1 and S2 normal, no murmur, rub or gallop  Breast Exam:    No tenderness, masses, or nipple abnormality. Scar on upper left breast from lumpectomy  Abdomen:     Soft, non-tender, bowel sounds active all four quadrants, no masses, no organomegaly.    Genitalia:    Pelvic:external genitalia normal, vagina without lesions, discharge, or tenderness, rectovaginal septum  normal. Cervix with post-LEEP changes, otherwise normal, no cervical motion tenderness, no adnexal masses or tenderness. Uterus  normal size, shape, mobile, regular contours, nontender.  Rectal:    Normal external sphincter.  No hemorrhoids appreciated. Internal exam not done.   Extremities:   Extremities normal, atraumatic, no cyanosis or edema  Neurologic:   Grossly normal     Labs:  Lab Results  Component Value Date   WBC 5.7 07/04/2019   HGB 14.7 07/04/2019   HCT 44.0 07/04/2019   MCV 92 07/04/2019   PLT 234 07/04/2019    Lab Results  Component Value Date   CREATININE 0.87 07/04/2019   BUN 13 07/04/2019   NA 140 07/04/2019   K 4.6 07/04/2019   CL 102 07/04/2019   CO2 20 07/04/2019    Lab Results  Component Value Date   ALT 19 07/04/2019   AST 21 07/04/2019   ALKPHOS 63 07/04/2019   BILITOT 0.7 07/04/2019    Lab Results  Component Value Date   TSH 1.590 07/04/2019    Lab Results  Component Value Date   CHOL 165 07/04/2019   HDL 67 07/04/2019   LDLCALC 81 07/04/2019   TRIG 86 07/04/2019   CHOLHDL 2.5 07/04/2019     Assessment:    Plan:     Blood tests performed with PCP last week. Patient was started on Vitamin D3 5000 and Crestor 20 mg due to abnormalities in labwork.  Breast self exam technique reviewed and patient encouraged to perform self-exam monthly. Contraception: rhythm method Discussed healthy lifestyle modifications. Praised healthy changes that pt has made thus far. H/o LGSIL pap followed by normal colposcopy and negative biopsies in 2017.  Notes h/o abnormal pap smears in the past. Has h/o LEEP. Normal pap smear 06/26/2018 and 07/02/2019. Continue with routine screening every 3 years. H/o breast lumpectomy (benign findings). Normal mammo 06/12/2020. Continue routine screening.  COVID vaccination series completed.  Follow up in 1 year or sooner if needed.    I have seen and examined the patient with Doristine Locks, [place] PA-S.  I have reviewed the record and concur with patient management and plan.   Rubie Maid, MD [facility]

## 2020-07-03 NOTE — Progress Notes (Signed)
Pt present for annual exam. Pt stated that she doing well no problems no issues at this time.

## 2020-07-03 NOTE — Patient Instructions (Signed)
Preventive Care 48-48 Years Old, Female Preventive care refers to visits with your health care provider and lifestyle choices that can promote health and wellness. This includes:  A yearly physical exam. This may also be called an annual well check.  Regular dental visits and eye exams.  Immunizations.  Screening for certain conditions.  Healthy lifestyle choices, such as eating a healthy diet, getting regular exercise, not using drugs or products that contain nicotine and tobacco, and limiting alcohol use. What can I expect for my preventive care visit? Physical exam Your health care provider will check your:  Height and weight. This may be used to calculate body mass index (BMI), which tells if you are at a healthy weight.  Heart rate and blood pressure.  Skin for abnormal spots. Counseling Your health care provider may ask you questions about your:  Alcohol, tobacco, and drug use.  Emotional well-being.  Home and relationship well-being.  Sexual activity.  Eating habits.  Work and work environment.  Method of birth control.  Menstrual cycle.  Pregnancy history. What immunizations do I need?  Influenza (flu) vaccine  This is recommended every year. Tetanus, diphtheria, and pertussis (Tdap) vaccine  You may need a Td booster every 10 years. Varicella (chickenpox) vaccine  You may need this if you have not been vaccinated. Zoster (shingles) vaccine  You may need this after age 60. Measles, mumps, and rubella (MMR) vaccine  You may need at least one dose of MMR if you were born in 1957 or later. You may also need a second dose. Pneumococcal conjugate (PCV13) vaccine  You may need this if you have certain conditions and were not previously vaccinated. Pneumococcal polysaccharide (PPSV23) vaccine  You may need one or two doses if you smoke cigarettes or if you have certain conditions. Meningococcal conjugate (MenACWY) vaccine  You may need this if you  have certain conditions. Hepatitis A vaccine  You may need this if you have certain conditions or if you travel or work in places where you may be exposed to hepatitis A. Hepatitis B vaccine  You may need this if you have certain conditions or if you travel or work in places where you may be exposed to hepatitis B. Haemophilus influenzae type b (Hib) vaccine  You may need this if you have certain conditions. Human papillomavirus (HPV) vaccine  If recommended by your health care provider, you may need three doses over 6 months. You may receive vaccines as individual doses or as more than one vaccine together in one shot (combination vaccines). Talk with your health care provider about the risks and benefits of combination vaccines. What tests do I need? Blood tests  Lipid and cholesterol levels. These may be checked every 5 years, or more frequently if you are over 48 years old.  Hepatitis C test.  Hepatitis B test. Screening  Lung cancer screening. You may have this screening every year starting at age 48 if you have a 30-pack-year history of smoking and currently smoke or have quit within the past 15 years.  Colorectal cancer screening. All adults should have this screening starting at age 48 and continuing until age 48. Your health care provider may recommend screening at age 45 if you are at increased risk. You will have tests every 1-10 years, depending on your results and the type of screening test.  Diabetes screening. This is done by checking your blood sugar (glucose) after you have not eaten for a while (fasting). You may have this   done every 1-3 years.  Mammogram. This may be done every 1-2 years. Talk with your health care provider about when you should start having regular mammograms. This may depend on whether you have a family history of breast cancer.  BRCA-related cancer screening. This may be done if you have a family history of breast, ovarian, tubal, or peritoneal  cancers.  Pelvic exam and Pap test. This may be done every 3 years starting at age 48. Starting at age 7, this may be done every 5 years if you have a Pap test in combination with an HPV test. Other tests  Sexually transmitted disease (STD) testing.  Bone density scan. This is done to screen for osteoporosis. You may have this scan if you are at high risk for osteoporosis. Follow these instructions at home: Eating and drinking  Eat a diet that includes fresh fruits and vegetables, whole grains, lean protein, and low-fat dairy.  Take vitamin and mineral supplements as recommended by your health care provider.  Do not drink alcohol if: ? Your health care provider tells you not to drink. ? You are pregnant, may be pregnant, or are planning to become pregnant.  If you drink alcohol: ? Limit how much you have to 0-1 drink a day. ? Be aware of how much alcohol is in your drink. In the [place] one drink equals one 12 oz bottle of beer (355 mL), one 5 oz glass of wine (148 mL), or one 1 oz glass of hard liquor (44 mL). Lifestyle  Take daily care of your teeth and gums.  Stay active. Exercise for at least 30 minutes on 5 or more days each week.  Do not use any products that contain nicotine or tobacco, such as cigarettes, e-cigarettes, and chewing tobacco. If you need help quitting, ask your health care provider.  If you are sexually active, practice safe sex. Use a condom or other form of birth control (contraception) in order to prevent pregnancy and STIs (sexually transmitted infections).  If told by your health care provider, take low-dose aspirin daily starting at age 48. What's next?  Visit your health care provider once a year for a well check visit.  Ask your health care provider how often you should have your eyes and teeth checked.  Stay up to date on all vaccines. This information is not intended to replace advice given to you by your health care provider. Make sure you  discuss any questions you have with your health care provider. Document Revised: 08/02/2018 Document Reviewed: 08/02/2018 Elsevier Patient Education  2020 [place] Breast self-awareness is knowing how your breasts look and feel. Doing breast self-awareness is important. It allows you to catch a breast problem early while it is still small and can be treated. All women should do breast self-awareness, including women who have had breast implants. Tell your doctor if you notice a change in your breasts. What you need:  A mirror.  A well-lit room. How to do a breast self-exam A breast self-exam is one way to learn what is normal for your breasts and to check for changes. To do a breast self-exam: Look for changes  1. Take off all the clothes above your waist. 2. Stand in front of a mirror in a room with good lighting. 3. Put your hands on your hips. 4. Push your hands down. 5. Look at your breasts and nipples in the mirror to see if one breast or nipple looks different from the  other. Check to see if: ? The shape of one breast is different. ? The size of one breast is different. ? There are wrinkles, dips, and bumps in one breast and not the other. 6. Look at each breast for changes in the skin, such as: ? Redness. ? Scaly areas. 7. Look for changes in your nipples, such as: ? Liquid around the nipples. ? Bleeding. ? Dimpling. ? Redness. ? A change in where the nipples are. Feel for changes  1. Lie on your back on the floor. 2. Feel each breast. To do this, follow these steps: ? Pick a breast to feel. ? Put the arm closest to that breast above your head. ? Use your other arm to feel the nipple area of your breast. Feel the area with the pads of your three middle fingers by making small circles with your fingers. For the first circle, press lightly. For the second circle, press harder. For the third circle, press even harder. ? Keep making circles with  your fingers at the different pressures as you move down your breast. Stop when you feel your ribs. ? Move your fingers a little toward the center of your body. ? Start making circles with your fingers again, this time going up until you reach your collarbone. ? Keep making up-and-down circles until you reach your armpit. Remember to keep using the three pressures. ? Feel the other breast in the same way. 3. Sit or stand in the tub or shower. 4. With soapy water on your skin, feel each breast the same way you did in step 2 when you were lying on the floor. Write down what you find Writing down what you find can help you remember what to tell your doctor. Write down:  What is normal for each breast.  Any changes you find in each breast, including: ? The kind of changes you find. ? Whether you have pain. ? Size and location of any lumps.  When you last had your menstrual period. General tips  Check your breasts every month.  If you are breastfeeding, the best time to check your breasts is after you feed your baby or after you use a breast pump.  If you get menstrual periods, the best time to check your breasts is 5-7 days after your menstrual period is over.  With time, you will become comfortable with the self-exam, and you will begin to know if there are changes in your breasts. Contact a doctor if you:  See a change in the shape or size of your breasts or nipples.  See a change in the skin of your breast or nipples, such as red or scaly skin.  Have fluid coming from your nipples that is not normal.  Find a lump or thick area that was not there before.  Have pain in your breasts.  Have any concerns about your breast health. Summary  Breast self-awareness includes looking for changes in your breasts, as well as feeling for changes within your breasts.  Breast self-awareness should be done in front of a mirror in a well-lit room.  You should check your breasts every month.  If you get menstrual periods, the best time to check your breasts is 5-7 days after your menstrual period is over.  Let your doctor know of any changes you see in your breasts, including changes in size, changes on the skin, pain or tenderness, or fluid from your nipples that is not normal. This information is not  intended to replace advice given to you by your health care provider. Make sure you discuss any questions you have with your health care provider. Document Revised: 07/10/2018 Document Reviewed: 07/10/2018 Elsevier Patient Education  [place].

## 2020-07-04 ENCOUNTER — Encounter: Payer: Self-pay | Admitting: Obstetrics and Gynecology

## 2020-07-04 DIAGNOSIS — E785 Hyperlipidemia, unspecified: Secondary | ICD-10-CM | POA: Insufficient documentation

## 2020-07-04 DIAGNOSIS — E559 Vitamin D deficiency, unspecified: Secondary | ICD-10-CM | POA: Insufficient documentation

## 2021-05-21 ENCOUNTER — Other Ambulatory Visit: Payer: Self-pay | Admitting: Nurse Practitioner

## 2021-05-21 DIAGNOSIS — Z1231 Encounter for screening mammogram for malignant neoplasm of breast: Secondary | ICD-10-CM

## 2021-06-14 ENCOUNTER — Ambulatory Visit
Admission: RE | Admit: 2021-06-14 | Discharge: 2021-06-14 | Disposition: A | Discharge status: Home / Self Care | Payer: BC Managed Care – PPO | Source: Ambulatory Visit | Attending: Nurse Practitioner | Admitting: Nurse Practitioner

## 2021-06-14 ENCOUNTER — Other Ambulatory Visit: Payer: Self-pay

## 2021-06-14 DIAGNOSIS — Z1231 Encounter for screening mammogram for malignant neoplasm of breast: Secondary | ICD-10-CM | POA: Diagnosis not present

## 2021-07-06 ENCOUNTER — Other Ambulatory Visit: Payer: Self-pay

## 2021-07-06 ENCOUNTER — Encounter: Payer: Self-pay | Admitting: Obstetrics and Gynecology

## 2021-07-06 ENCOUNTER — Ambulatory Visit (INDEPENDENT_AMBULATORY_CARE_PROVIDER_SITE_OTHER): Payer: Self-pay | Admitting: Obstetrics and Gynecology

## 2021-07-06 VITALS — BP 94/68 | HR 79 | Resp 16 | Ht 66.0 in | Wt 156.5 lb

## 2021-07-06 DIAGNOSIS — E785 Hyperlipidemia, unspecified: Secondary | ICD-10-CM

## 2021-07-06 DIAGNOSIS — Z1211 Encounter for screening for malignant neoplasm of colon: Secondary | ICD-10-CM

## 2021-07-06 DIAGNOSIS — N951 Menopausal and female climacteric states: Secondary | ICD-10-CM

## 2021-07-06 DIAGNOSIS — Z01419 Encounter for gynecological examination (general) (routine) without abnormal findings: Secondary | ICD-10-CM

## 2021-07-06 DIAGNOSIS — Z8742 Personal history of other diseases of the female genital tract: Secondary | ICD-10-CM

## 2021-07-06 NOTE — Patient Instructions (Signed)
Preventive Care 68-49 Years Old, Female Preventive care refers to lifestyle choices and visits with your health care provider that can promote health and wellness. This includes: A yearly physical exam. This is also called an annual wellness visit. Regular dental and eye exams. Immunizations. Screening for certain conditions. Healthy lifestyle choices, such as: Eating a healthy diet. Getting regular exercise. Not using drugs or products that contain nicotine and tobacco. Limiting alcohol use. What can I expect for my preventive care visit? Physical exam Your health care provider will check your: Height and weight. These may be used to calculate your BMI (body mass index). BMI is a measurement that tells if you are at a healthy weight. Heart rate and blood pressure. Body temperature. Skin for abnormal spots. Counseling Your health care provider may ask you questions about your: Past medical problems. Family's medical history. Alcohol, tobacco, and drug use. Emotional well-being. Home life and relationship well-being. Sexual activity. Diet, exercise, and sleep habits. Work and work Statistician. Access to firearms. Method of birth control. Menstrual cycle. Pregnancy history. What immunizations do I need?  Vaccines are usually given at various ages, according to a schedule. Your health care provider will recommend vaccines for you based on your age, medicalhistory, and lifestyle or other factors, such as travel or where you work. What tests do I need? Blood tests Lipid and cholesterol levels. These may be checked every 5 years, or more often if you are over 37 years old. Hepatitis C test. Hepatitis B test. Screening Lung cancer screening. You may have this screening every year starting at age 30 if you have a 30-pack-year history of smoking and currently smoke or have quit within the past 15 years. Colorectal cancer screening. All adults should have this screening starting at  age 23 and continuing until age 3. Your health care provider may recommend screening at age 88 if you are at increased risk. You will have tests every 1-10 years, depending on your results and the type of screening test. Diabetes screening. This is done by checking your blood sugar (glucose) after you have not eaten for a while (fasting). You may have this done every 1-3 years. Mammogram. This may be done every 1-2 years. Talk with your health care provider about when you should start having regular mammograms. This may depend on whether you have a family history of breast cancer. BRCA-related cancer screening. This may be done if you have a family history of breast, ovarian, tubal, or peritoneal cancers. Pelvic exam and Pap test. This may be done every 3 years starting at age 79. Starting at age 54, this may be done every 5 years if you have a Pap test in combination with an HPV test. Other tests STD (sexually transmitted disease) testing, if you are at risk. Bone density scan. This is done to screen for osteoporosis. You may have this scan if you are at high risk for osteoporosis. Talk with your health care provider about your test results, treatment options,and if necessary, the need for more tests. Follow these instructions at home: Eating and drinking  Eat a diet that includes fresh fruits and vegetables, whole grains, lean protein, and low-fat dairy products. Take vitamin and mineral supplements as recommended by your health care provider. Do not drink alcohol if: Your health care provider tells you not to drink. You are pregnant, may be pregnant, or are planning to become pregnant. If you drink alcohol: Limit how much you have to 0-1 drink a day. Be aware  of how much alcohol is in your drink. In the [place] one drink equals one 12 oz bottle of beer (355 mL), one 5 oz glass of wine (148 mL), or one 1 oz glass of hard liquor (44 mL).  Lifestyle Take daily care of your teeth and  gums. Brush your teeth every morning and night with fluoride toothpaste. Floss one time each day. Stay active. Exercise for at least 30 minutes 5 or more days each week. Do not use any products that contain nicotine or tobacco, such as cigarettes, e-cigarettes, and chewing tobacco. If you need help quitting, ask your health care provider. Do not use drugs. If you are sexually active, practice safe sex. Use a condom or other form of protection to prevent STIs (sexually transmitted infections). If you do not wish to become pregnant, use a form of birth control. If you plan to become pregnant, see your health care provider for a prepregnancy visit. If told by your health care provider, take low-dose aspirin daily starting at age 66. Find healthy ways to cope with stress, such as: Meditation, yoga, or listening to music. Journaling. Talking to a trusted person. Spending time with friends and family. Safety Always wear your seat belt while driving or riding in a vehicle. Do not drive: If you have been drinking alcohol. Do not ride with someone who has been drinking. When you are tired or distracted. While texting. Wear a helmet and other protective equipment during sports activities. If you have firearms in your house, make sure you follow all gun safety procedures. What's next? Visit your health care provider once a year for an annual wellness visit. Ask your health care provider how often you should have your eyes and teeth checked. Stay up to date on all vaccines. This information is not intended to replace advice given to you by your health care provider. Make sure you discuss any questions you have with your healthcare provider. Document Revised: 08/25/2020 Document Reviewed: 08/02/2018 Elsevier Patient Education  2022 [place] Breast self-awareness is knowing how your breasts look and feel. Doing breast self-awareness is important. It allows you to catch a  breast problem early while it is still small and can be treated. All women should do breast self-awareness, including women who have had breast implants. Tell your doctorif you notice a change in your breasts. What you need: A mirror. A well-lit room. How to do a breast self-exam A breast self-exam is one way to learn what is normal for your breasts and tocheck for changes. To do a breast self-exam: Look for changes  Take off all the clothes above your waist. Stand in front of a mirror in a room with good lighting. Put your hands on your hips. Push your hands down. Look at your breasts and nipples in the mirror to see if one breast or nipple looks different from the other. Check to see if: The shape of one breast is different. The size of one breast is different. There are wrinkles, dips, and bumps in one breast and not the other. Look at each breast for changes in the skin, such as: Redness. Scaly areas. Look for changes in your nipples, such as: Liquid around the nipples. Bleeding. Dimpling. Redness. A change in where the nipples are.  Feel for changes  Lie on your back on the floor. Feel each breast. To do this, follow these steps: Pick a breast to feel. Put the arm closest to that breast above your head.  Use your other arm to feel the nipple area of your breast. Feel the area with the pads of your three middle fingers by making small circles with your fingers. For the first circle, press lightly. For the second circle, press harder. For the third circle, press even harder. Keep making circles with your fingers at the different pressures as you move down your breast. Stop when you feel your ribs. Move your fingers a little toward the center of your body. Start making circles with your fingers again, this time going up until you reach your collarbone. Keep making up-and-down circles until you reach your armpit. Remember to keep using the three pressures. Feel the other breast  in the same way. Sit or stand in the tub or shower. With soapy water on your skin, feel each breast the same way you did in step 2 when you were lying on the floor.  Write down what you find Writing down what you find can help you remember what to tell your doctor. Write down: What is normal for each breast. Any changes you find in each breast, including: The kind of changes you find. Whether you have pain. Size and location of any lumps. When you last had your menstrual period. General tips Check your breasts every month. If you are breastfeeding, the best time to check your breasts is after you feed your baby or after you use a breast pump. If you get menstrual periods, the best time to check your breasts is 5-7 days after your menstrual period is over. With time, you will become comfortable with the self-exam, and you will begin to know if there are changes in your breasts. Contact a doctor if you: See a change in the shape or size of your breasts or nipples. See a change in the skin of your breast or nipples, such as red or scaly skin. Have fluid coming from your nipples that is not normal. Find a lump or thick area that was not there before. Have pain in your breasts. Have any concerns about your breast health. Summary Breast self-awareness includes looking for changes in your breasts, as well as feeling for changes within your breasts. Breast self-awareness should be done in front of a mirror in a well-lit room. You should check your breasts every month. If you get menstrual periods, the best time to check your breasts is 5-7 days after your menstrual period is over. Let your doctor know of any changes you see in your breasts, including changes in size, changes on the skin, pain or tenderness, or fluid from your nipples that is not normal. This information is not intended to replace advice given to you by your health care provider. Make sure you discuss any questions you have with  your healthcare provider. Document Revised: 07/10/2018 Document Reviewed: 07/10/2018 Elsevier Patient Education  [place].

## 2021-07-06 NOTE — Progress Notes (Signed)
GYNECOLOGY ANNUAL PHYSICAL EXAM PROGRESS NOTE  Subjective:    Jasmine Hodges is a 50 y.o. [id number] female who presents for an annual exam. She is overall doing well today and has no questions or concerns that she would like to address. She was recently started on Vitamin D3 and Crestor by her PCP due to lab findings. The patient is sexually active. The patient wears seatbelts: yes. She's started regular exercise and eating healthily last week- doing [employer], which comes with healthy diet plan. Has the patient ever been transfused or tattooed?: no. The patient reports that there is not domestic violence in her life.   Notes that she has recently moved to [place] last month. Will be transitioning care.   Menstrual History: Menarche age: 49 Patient's last menstrual period was 06/18/2021 (exact date). Period Cycle (Days): 21 Period Duration (Days): 7-8 Period Pattern: (!) Irregular Menstrual Flow: Heavy Menstrual Control: Tampon Menstrual Control Change Freq (Hours): 2 Dysmenorrhea: None   Menarche age: 78 Patient's last menstrual period was 06/18/2021. Cycles are getting to be closer and closer together (sometimes as short as 21 days). Also getting heavier, changing a pad/tampon per 1-2 hours for 2-3 days.  Contraception: rhythm method. History of STI's: Denies Last Pap: 07/02/2019. Results were: normal. H/o LGSIL pap followed by normal colposcopy and negative biopsies in 2017.  Notes h/o abnormal pap smears in the past. Has h/o LEEP. Last mammogram: 06/14/2021, normal.  (04/10/2018- results were: abnormal, mass in upper inner quadrant of right breast. Followed by right breast lumpectomy (benign)    OB History  Gravida Para Term Preterm AB Living  '2 2 2 '$ 0 0 2  SAB IAB Ectopic Multiple Live Births  0 0 0 0 2    # Outcome Date GA Lbr Len/2nd Weight Sex Delivery Anes PTL Lv  2 Term 2002    F Vag-Spont   LIV  1 Term 1999    M Vag-Spont   LIV    Past Medical History:   Diagnosis Date   Anxiety    CIN II (cervical intraepithelial neoplasia II)    Depression    GERD (gastroesophageal reflux disease)    h/o   History of abnormal cells from cervix    History of kidney stones    LGSIL (low grade squamous intraepithelial dysplasia)    Reflux gastritis    Sleep disturbance    Vaginal Pap smear, abnormal    Vitamin D deficiency     Past Surgical History:  Procedure Laterality Date   BREAST BIOPSY Left 05/08/2018   path pending   BREAST BIOPSY Left 05/31/2018   Procedure: BREAST BIOPSY WITH NEEDLE LOCALIZATION;  Surgeon: Herbert Pun, MD;  Location: [facility];  Service: General;  Laterality: Left;   BREAST EXCISIONAL BIOPSY     COLPOSCOPY W/ BIOPSY / CURETTAGE     CYSTOSCOPY W/ RETROGRADES Bilateral 04/11/2016   Procedure: CYSTOSCOPY WITH RETROGRADE PYELOGRAM;  Surgeon: Hollice Espy, MD;  Location: [facility];  Service: Urology;  Laterality: Bilateral;   CYSTOSCOPY/URETEROSCOPY/HOLMIUM LASER/STENT PLACEMENT Left 04/11/2016   Procedure: CYSTOSCOPY/URETEROSCOPY/HOLMIUM LASER/STENT PLACEMENT;  Surgeon: Hollice Espy, MD;  Location: [facility];  Service: Urology;  Laterality: Left;   FOOT SURGERY Left 2010   HYSTEROSCOPY N/A 09/22/2017   Procedure: HYSTEROSCOPY;  Surgeon: Rubie Maid, MD;  Location: [facility];  Service: Gynecology;  Laterality: N/A;   IUD REMOVAL N/A 09/22/2017   Procedure: INTRAUTERINE DEVICE (IUD) REMOVAL;  Surgeon: Rubie Maid, MD;  Location: [facility];  Service: Gynecology;  Laterality: N/A;   LEEP  02/2015   STONE EXTRACTION WITH BASKET Left 04/11/2016   Procedure: STONE EXTRACTION WITH BASKET;  Surgeon: Hollice Espy, MD;  Location: [facility];  Service: Urology;  Laterality: Left;    Family History  Problem Relation Age of Onset   Lung cancer Mother    Hypertension Father    Kidney Stones Father    Diabetes Father    Kidney disease Neg Hx    Bladder Cancer Neg Hx    Breast cancer Neg Hx     Social History    Socioeconomic History   Marital status: Married    Spouse name: Not on file   Number of children: Not on file   Years of education: Not on file   Highest education level: Not on file  Occupational History   Not on file  Tobacco Use   Smoking status: Every Day    Packs/day: 0.50    Years: 20.00    Pack years: 10.00    Types: Cigarettes    Last attempt to quit: 01/30/2013    Years since quitting: 8.4   Smokeless tobacco: Never  Vaping Use   Vaping Use: Never used  Substance and Sexual Activity   Alcohol use: Yes    Alcohol/week: 3.0 standard drinks    Types: 3 Glasses of wine per week    Comment: occas wine   Drug use: No   Sexual activity: Yes    Birth control/protection: None  Other Topics Concern   Not on file  Social History Narrative   Not on file   Social Determinants of Health   Financial Resource Strain: Not on file  Food Insecurity: Not on file  Transportation Needs: Not on file  Physical Activity: Not on file  Stress: Not on file  Social Connections: Not on file  Intimate Partner Violence: Not on file    Current Outpatient Medications on File Prior to Visit  Medication Sig Dispense Refill   ALPRAZolam (XANAX) 0.5 MG tablet Take 1 tablet (0.5 mg total) by mouth at bedtime as needed for anxiety. 30 tablet 3   Cholecalciferol (DIALYVITE VITAMIN D 5000) 125 MCG (5000 UT) capsule Take 5,000 Units by mouth daily.     Melatonin 10 MG TABS Take 10 mg by mouth at bedtime.      rosuvastatin (CRESTOR) 20 MG tablet Take 20 mg by mouth daily.     No current facility-administered medications on file prior to visit.    No Known Allergies   Review of Systems Constitutional: negative for chills, fatigue, fevers and sweats Eyes: negative for irritation, redness and visual disturbance Ears, nose, mouth, throat, and face: negative for hearing loss, nasal congestion, snoring and tinnitus Respiratory: negative for asthma, cough, sputum Cardiovascular: negative for  chest pain, dyspnea, exertional chest pressure/discomfort, irregular heart beat, palpitations and syncope Gastrointestinal: negative for abdominal pain, change in bowel habits, nausea and vomiting Genitourinary: negative for abnormal menstrual periods, genital lesions, sexual problems and vaginal discharge, dysuria and urinary incontinence Integument/breast: negative for breast lump, breast tenderness and nipple discharge Hematologic/lymphatic: negative for bleeding and easy bruising Musculoskeletal:negative for back pain and muscle weakness Neurological: negative for dizziness, headaches, vertigo and weakness Endocrine: negative for diabetic symptoms including polydipsia, polyuria and skin dryness Allergic/Immunologic: negative for hay fever and urticaria     Objective:  Blood pressure 94/68, pulse 79, resp. rate 16, height '5\' 6"'$  (1.676 m), weight 156 lb 8 oz (71 kg), last menstrual period 06/18/2021.  Body mass index is 25.26 kg/m.  General Appearance:    Alert, cooperative, no distress, appears stated age  Head:    Normocephalic, without obvious abnormality, atraumatic  Eyes:    PERRL, conjunctiva/corneas clear, EOM's intact, both eyes  Ears:    Normal external ear canals, both ears  Nose:   Nares normal, septum midline, mucosa normal, no drainage or sinus tenderness  Throat:   Lips, mucosa, and tongue normal; teeth and gums normal  Neck:   Supple, symmetrical, trachea midline, no adenopathy; thyroid: no enlargement/tenderness/nodules; no carotid bruit or JVD  Back:     Symmetric, no curvature, ROM normal, no CVA tenderness  Lungs:     Clear to auscultation bilaterally, respirations unlabored  Chest Wall:    No tenderness or deformity   Heart:    Regular rate and rhythm, S1 and S2 normal, no murmur, rub or gallop  Breast Exam:    No tenderness, masses, or nipple abnormality  Abdomen:     Soft, non-tender, bowel sounds active all four quadrants, no masses, no organomegaly.    Genitalia:     Pelvic:external genitalia normal, vagina without lesions or tenderness,small amount of thin white discharge present. Rectovaginal septum  normal. Cervix normal in appearance, no cervical motion tenderness, no adnexal masses or tenderness.  Uterus normal size, shape, mobile, regular contours, nontender.  Rectal:    Normal external sphincter.  No hemorrhoids appreciated. Internal exam not done.   Extremities:   Extremities normal, atraumatic, no cyanosis or edema  Pulses:   2+ and symmetric all extremities  Skin:   Skin color, texture, turgor normal, no rashes or lesions  Lymph nodes:   Cervical, supraclavicular, and axillary nodes normal  Neurologic:   CNII-XII intact, normal strength, sensation and reflexes throughout   .  Labs:  Patient has had labs performed by PCP in the past several months.   Assessment:   1. Encounter for well woman exam with routine gynecological exam   2. Colon cancer screening   3. Dyslipidemia   4. History of abnormal cervical Pap smear   5. Perimenopausal      Plan:  Blood tests: None ordered. . Breast self exam technique reviewed and patient encouraged to perform self-exam monthly. Contraception: rhythm method. Discussed healthy lifestyle modifications. Mammogram  06/14/2021.  Up to date.  Colon cancer screening discussed, patient ok to perform Cologuard. Pap smear  up to date.  Prior h/o abnormal pap smear and LEEP in 2017, f/u has been normal . COVID vaccination status: has completed vaccination series, is eligible for booster.  Dyslipidemia managed by PCP, well controlled on meds.   Follow up in 1 year for annual exam   Rubie Maid, MD [facility]
